# Patient Record
Sex: Female | Born: 1990 | Race: White | Hispanic: No | Smoking: Former smoker
Health system: Southern US, Community
[De-identification: ages and names within clinical notes are randomized; demographics above are authoritative.]

## PROBLEM LIST (undated history)

## (undated) HISTORY — PX: NO PAST SURGERIES: SHX2092

---
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
## 2009-10-14 IMAGING — CR DG HAND COMPLETE 3+V*L*
3 series · 3 of 3 positions shown · non-contrast
Comparison: none

CLINICAL DATA: 16-year-old with silver trauma.
 LEFT HAND ? 3 VIEW:

[x hand pa left]
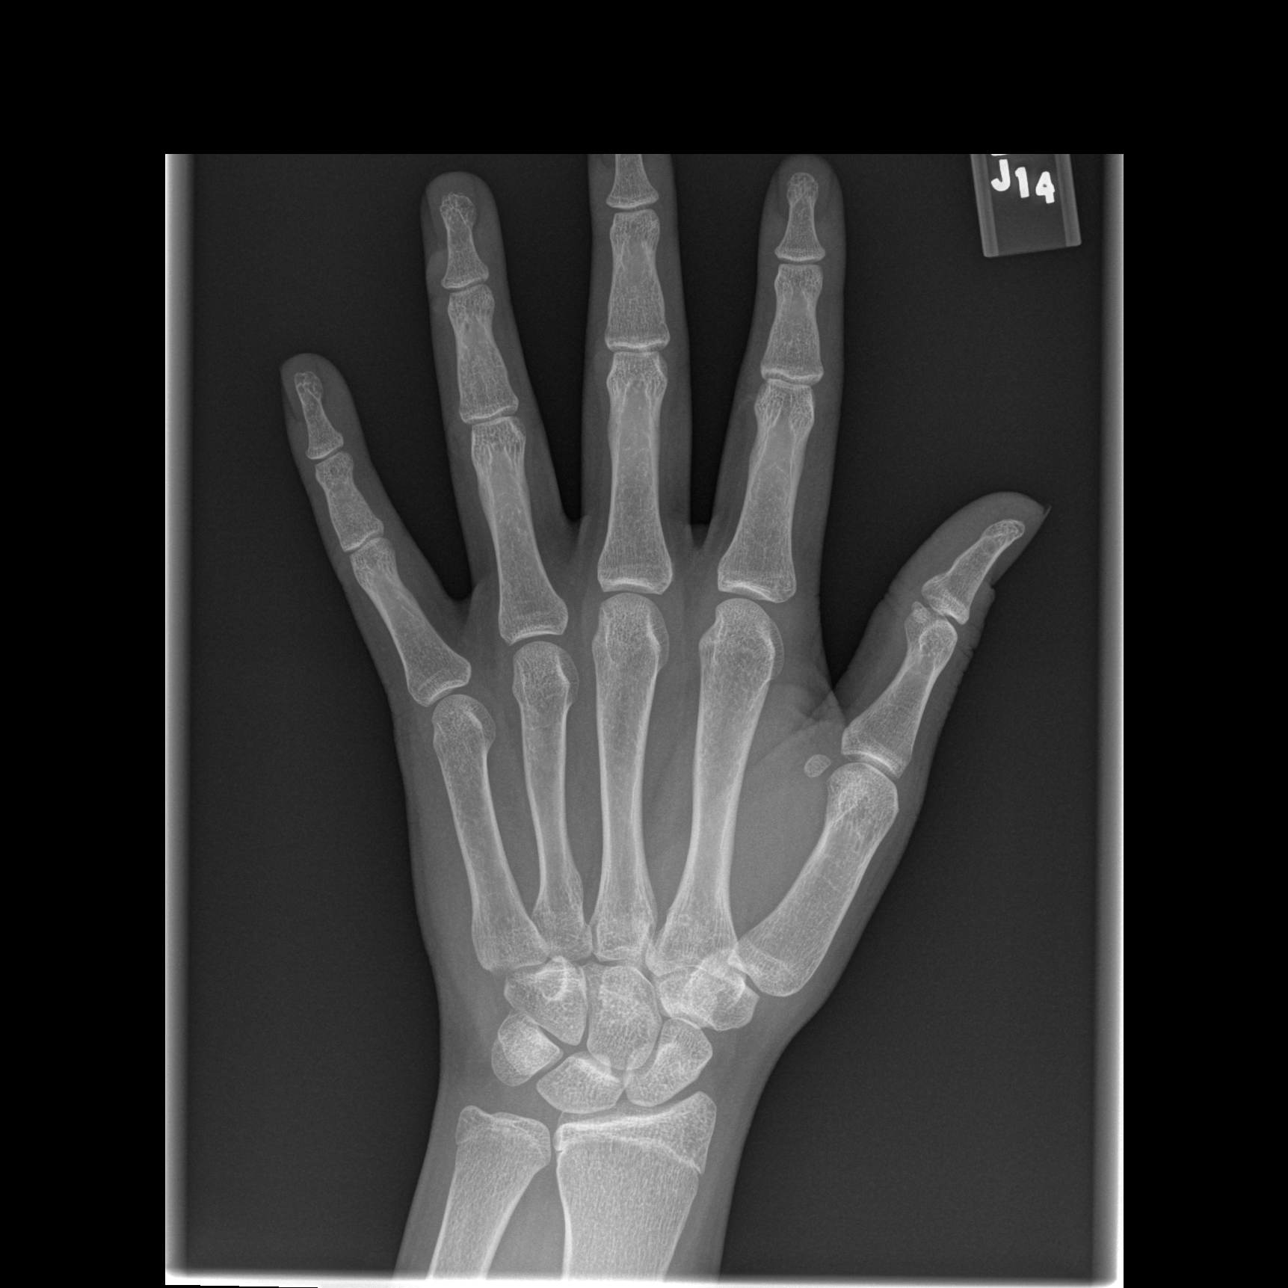

[x hand oblique left (1 of 2)]
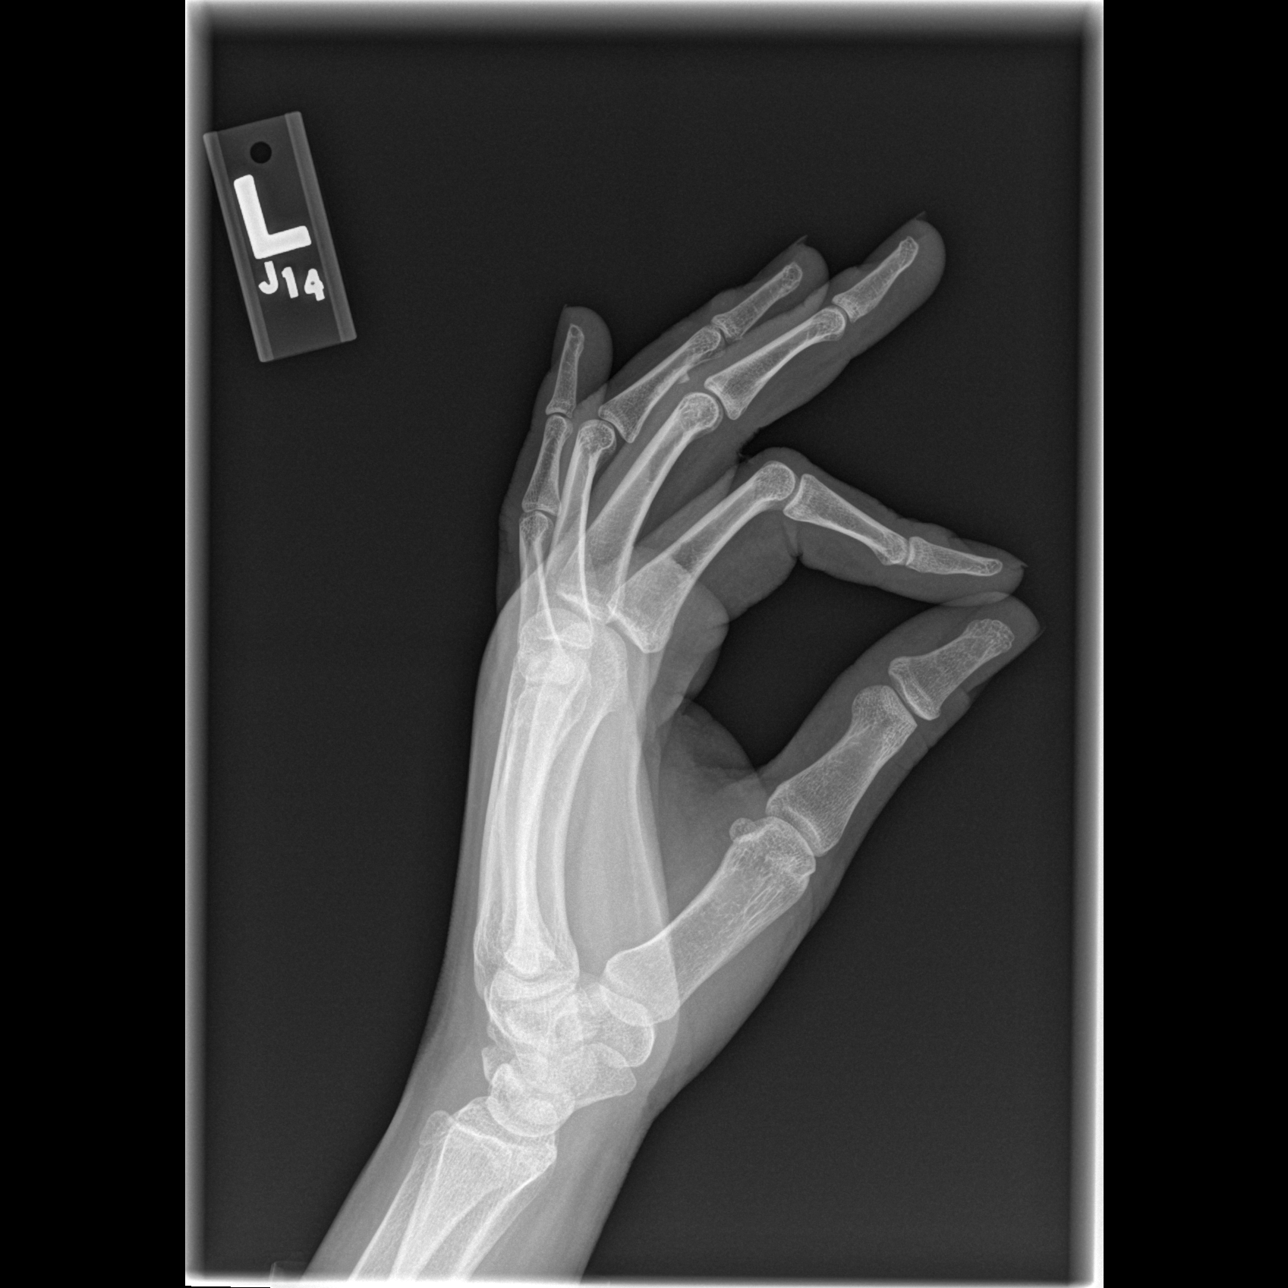

[x hand oblique left (2 of 2)]
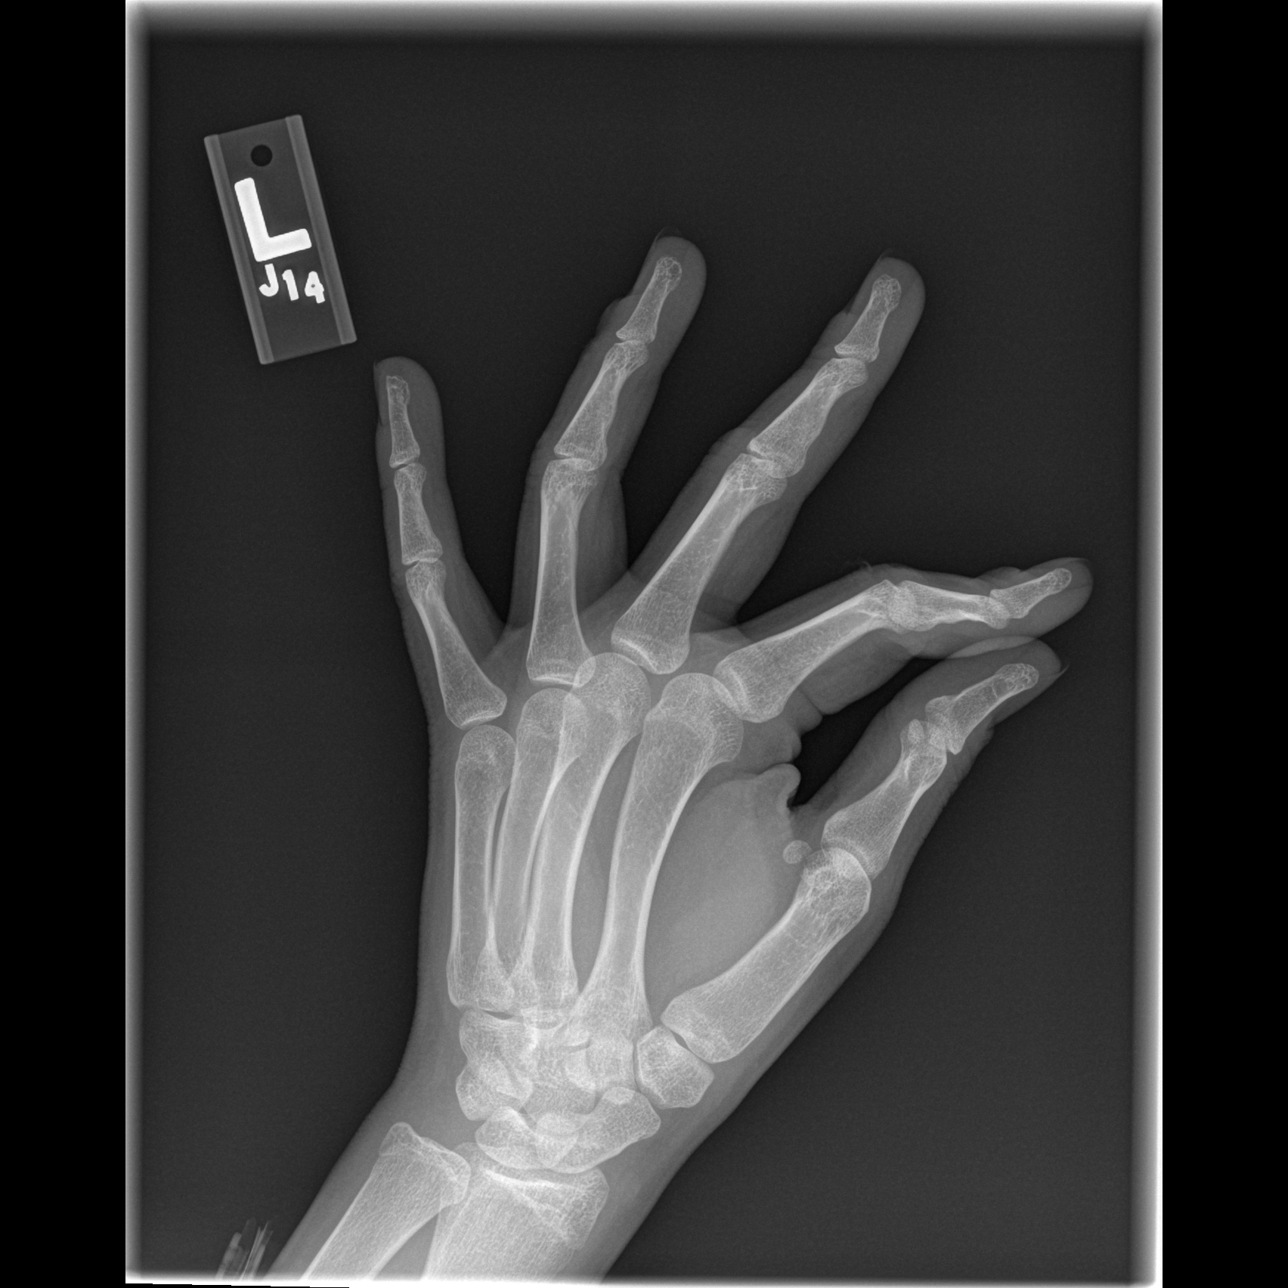

[3 of 3 positions shown; findings below may reference images not displayed]

FINDINGS: Three views of the left hand were obtained.  Normal alignment of the hand with incomplete closure of the distal radius physis.  No evidence for an acute fracture or dislocation.  The lateral view of the left hand demonstrates high-density material overlying the third and fourth fingers, possibly related to soft tissues.  Difficult to exclude foreign body in this area, although this is not identified on the additional images.
IMPRESSION: 1. No acute bone abnormalities to the left hand.
 2. Difficult to exclude a foreign body involving the third or fourth fingers, as described.

## 2010-12-11 LAB — I-STAT 8, (EC8 V) (CONVERTED LAB)
Acid-Base Excess: 2
Chloride: 106
Potassium: 3.9
Sodium: 140
TCO2: 29
pH, Ven: 7.388 — ABNORMAL HIGH

## 2010-12-11 LAB — URINALYSIS, ROUTINE W REFLEX MICROSCOPIC
Bilirubin Urine: NEGATIVE
Hgb urine dipstick: NEGATIVE
Nitrite: NEGATIVE
Protein, ur: NEGATIVE
Specific Gravity, Urine: 1.02
Urobilinogen, UA: 0.2

## 2010-12-11 LAB — POCT PREGNANCY, URINE: Operator id: 284141

## 2010-12-11 LAB — POCT I-STAT CREATININE
Creatinine, Ser: 0.6
Operator id: 284141

## 2022-03-09 ENCOUNTER — Encounter: Payer: Self-pay | Admitting: Family Medicine

## 2022-03-09 ENCOUNTER — Ambulatory Visit: Payer: Self-pay

## 2022-03-09 ENCOUNTER — Ambulatory Visit: Payer: No Typology Code available for payment source | Admitting: Family Medicine

## 2022-03-09 VITALS — BP 139/95 | Ht 66.0 in | Wt 132.8 lb

## 2022-03-09 DIAGNOSIS — F32A Depression, unspecified: Secondary | ICD-10-CM | POA: Insufficient documentation

## 2022-03-09 DIAGNOSIS — Z3009 Encounter for other general counseling and advice on contraception: Secondary | ICD-10-CM

## 2022-03-09 DIAGNOSIS — Z30432 Encounter for removal of intrauterine contraceptive device: Secondary | ICD-10-CM

## 2022-03-09 DIAGNOSIS — Z01419 Encounter for gynecological examination (general) (routine) without abnormal findings: Secondary | ICD-10-CM

## 2022-03-09 DIAGNOSIS — Z113 Encounter for screening for infections with a predominantly sexual mode of transmission: Secondary | ICD-10-CM

## 2022-03-09 LAB — WET PREP FOR TRICH, YEAST, CLUE
Trichomonas Exam: NEGATIVE
Yeast Exam: NEGATIVE

## 2022-03-09 MED ORDER — NORGESTIM-ETH ESTRAD TRIPHASIC 0.18/0.215/0.25 MG-35 MCG PO TABS: 1.0000 | ORAL_TABLET | Freq: Every day | ORAL | 12 refills | Status: DC

## 2022-03-09 NOTE — Progress Notes (Signed)
[facility] DEPARTMENT [facility] [address] Main Number: [phone]    Family Planning Visit- Initial Visit  Subjective:  Stephanie Reeves is a 31 y.o.  G1P1000   being seen today for an initial annual visit and to discuss reproductive life planning.  The patient is currently using IUD or IUS for pregnancy prevention. Patient reports   does not want a pregnancy in the next year.     report they are looking for a method that provides Does not want something inserted  Patient has the following medical conditions has Depression on their problem list.  Chief Complaint  Patient presents with   Contraception    PE and IUD removal    Patient reports to clinic for PE and IUD removal. Patient has had lots of anxiety surrounding her IUD removal, and has had the IUD for more than 10 years. It is a [employer] fast streamer.    Body mass index is 21.43 kg/m. - Patient is eligible for diabetes screening based on BMI and age >64?  not applicable HA1C ordered? not applicable  Patient reports 2  partner/s in last year. Desires STI screening?  Yes  Has patient been screened once for HCV in the past?  No  No results found for: "HCVAB"  Does the patient have current drug use (including MJ), have a partner with drug use, and/or has been incarcerated since last result? No  If yes-- Screen for HCV through [facility]   Does the patient meet criteria for HBV testing? No  Criteria:  -Household, sexual or needle sharing contact with HBV -History of drug use -HIV positive -Those with known Hep C   Health Maintenance Due  Topic Date Due   COVID-19 Vaccine (1) Never done   HIV Screening  Never done   Hepatitis C Screening  Never done   DTaP/Tdap/Td (1 - Tdap) Never done   PAP SMEAR-Modifier  Never done   INFLUENZA VACCINE  Never done    Review of Systems  Constitutional:  Positive for weight loss.  Respiratory:  Positive for shortness of breath. Negative for  wheezing.   Cardiovascular:  Negative for chest pain.  Genitourinary:  Positive for frequency.  Neurological:  Positive for dizziness and headaches.  Endo/Heme/Allergies:  Bruises/bleeds easily.  Psychiatric/Behavioral:  Positive for depression. Negative for suicidal ideas. The patient is nervous/anxious.     The following portions of the patient's history were reviewed and updated as appropriate: allergies, current medications, past family history, past medical history, past social history, past surgical history and problem list. Problem list updated.   See flowsheet for other program required questions.  Objective:   Vitals:   03/09/22 1310  BP: (!) 139/95  Weight: 132 lb 12.8 oz (60.2 kg)  Height: 5\' 6"  (1.676 m)    Physical Exam Vitals and nursing note reviewed. Exam conducted with a chaperone present.  Constitutional:      Appearance: Normal appearance.  HENT:     Head: Normocephalic and atraumatic.     Mouth/Throat:     Mouth: Mucous membranes are moist.     Pharynx: Oropharynx is clear. No oropharyngeal exudate or posterior oropharyngeal erythema.  Pulmonary:     Effort: Pulmonary effort is normal.  Chest:     Comments: CBE deferred Abdominal:     General: Abdomen is flat.     Palpations: There is no mass.     Tenderness: There is no abdominal tenderness. There is no rebound.  Genitourinary:    General: Normal vulva.     Exam position: Lithotomy position.     Pubic Area: No rash or pubic lice.      Labia:        Right: No rash or lesion.        Left: No rash or lesion.      Vagina: Normal. No vaginal discharge, erythema, bleeding or lesions.     Cervix: No discharge, friability, lesion or erythema.     Rectum: Normal.     Comments: pH = 4  IUD removed- Patient very nervous during exam. Uncomfortable with speculum placement, felt faint after removal of IUD- did not complete exam. No bimanual exam done.  Lymphadenopathy:     Head:     Right side of head: No  preauricular or posterior auricular adenopathy.     Left side of head: No preauricular or posterior auricular adenopathy.     Cervical: No cervical adenopathy.     Upper Body:     Right upper body: No supraclavicular, axillary or epitrochlear adenopathy.     Left upper body: No supraclavicular, axillary or epitrochlear adenopathy.     Lower Body: No right inguinal adenopathy. No left inguinal adenopathy.  Skin:    General: Skin is warm and dry.     Findings: No rash.  Neurological:     Mental Status: She is alert and oriented to person, place, and time.  Psychiatric:        Mood and Affect: Mood is anxious.       Assessment and Plan:  Stephanie Reeves is a 31 y.o. female presenting to the [facility] Department for an initial annual wellness/contraceptive visit  Contraception counseling: Reviewed options based on patient desire and reproductive life plan. Patient is interested in Oral Contraceptive. This was provided to the patient today.   Risks, benefits, and typical effectiveness rates were reviewed.  Questions were answered.  Written information was also given to the patient to review.    The patient will follow up in  1 years for surveillance.  The patient was told to call with any further questions, or with any concerns about this method of contraception.  Emphasized use of condoms 100% of the time for STI prevention.  Need for ECP was assessed.  1. Well woman exam with routine gynecological exam Pt has multiple complaints today: cold intolerance, weight loss, headaches, SOB. She attributes some of these to her anxiety and smoking status. Advised that patient should get a PCP to evaluate these concerns- as this could be an indication of asthma, thyroid disease etc. Deferred CBE today- patient panicked about visit- and declined CBE today.  - IGP, Aptima HPV  2. Encounter for IUD removal Patient states she has had this Mirena IUD for approx 14 years. Has had bad  anxiety about removal- so she has avoided coming to the office.  IUD Removal  Patient identified, informed consent performed, consent signed.  Patient was in the dorsal lithotomy position, normal external genitalia was noted.  A speculum was placed in the patient's vagina, normal discharge was noted, no lesions. The cervix was visualized, no lesions, no abnormal discharge.  The strings of the IUD were grasped and pulled using ring forceps. The IUD was removed in its entirety. Cytobrush was attempted which was unsuccessful so Kelly forceps were introduced into the endometrial cavity and the IUD was grasped and removed in its entirety).  Patient tolerated the procedure well.  Patient will use OCPs for contraception. Routine preventative health maintenance measures emphasized.   3. Family planning  - Norgestimate-Ethinyl Estradiol Triphasic (TRI-SPRINTEC) 0.18/0.215/0.25 MG-35 MCG tablet; Take 1 tablet by mouth daily.  Dispense: 28 tablet; Refill: 12  4. Screening for venereal disease Patient accepted all screenings including oral, vaginal CT/GC and wet prep. Patient meets criteria for HepB screening? No. Ordered? No - declined.  Patient meets criteria for HepC screening? No. Ordered? No - declined.   Treat wet prep per standing order Discussed time line for State Lab results and that patient will be called with positive results and encouraged patient to call if she had not heard in 2 weeks.  Counseled to return or seek care for continued or worsening symptoms Recommended condom use with all sex  Patient is currently using Hormonal Contraception: Injection, Rings and Patches to prevent pregnancy.   - Chlamydia/Gonorrhea [facility] - WET PREP FOR TRICH, YEAST, CLUE - Gonococcus culture  5. Depression, unspecified depression type PHQ-9 score of 14. Patient wanted card for therapist. States she has had a "lot of things going on right now". Declined referral to therapist- would like to reach  out herself. Denied suicidal ideation.    Return if symptoms worsen or fail to improve.  Total time spent 40 minutes.   Lenice Llamas, [place]

## 2022-03-09 NOTE — Progress Notes (Signed)
Pt appointment for PE, Pap, STI screening, and IUD removal. Pt seen by [facility]. Family planning packet given and contents reviewed. Initial lab results all negative and reviewed with pt.

## 2022-03-14 LAB — GONOCOCCUS CULTURE

## 2022-03-18 LAB — IGP, APTIMA HPV
HPV Aptima: POSITIVE — AB
PAP Smear Comment: 0

## 2022-03-23 ENCOUNTER — Telehealth: Payer: Self-pay

## 2022-03-23 NOTE — Telephone Encounter (Signed)
Calling pt re pap result from 03/09/22 specimen and colpo referral. 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  Insurance? Where does she want to go for colpo?

## 2022-03-24 NOTE — Telephone Encounter (Signed)
Phone call to pt at [phone]. Mailbox full, unable to leave voicemail. Tried twice.  Sent MyChart message.

## 2022-03-25 NOTE — Telephone Encounter (Signed)
Phone call to pt at [phone]. Mailbox full, unable to leave voicemail.

## 2022-04-02 NOTE — Telephone Encounter (Signed)
Phone call to [phone]. Left voicemail message that RN with [facility] is calling re pap and referral. Please call Phyllis Abelson at [phone]. Tried twice.

## 2022-04-02 NOTE — Telephone Encounter (Signed)
Colpo referral sent to [place] through Epic referral tab.

## 2022-04-02 NOTE — Telephone Encounter (Signed)
Received phone call back from pt and pt confirmed identity. Discussed pap result and colpo referral. Pt states she has [employer] and has been to [facility] before, would like to go back there for colpo. Explained that they are now [facility] and to expect a call from them. Pt expressed understanding.

## 2022-04-28 ENCOUNTER — Telehealth: Payer: Self-pay

## 2022-04-28 NOTE — Telephone Encounter (Addendum)
Received a call from [place] with [place]. They have not been able to contact pt for colpo scheduling.   (Reference [facility] provider order for colpo on 03/09/22 pap result.)  -Return call to [place] -Try to call pt -Send certified letter to pt

## 2022-04-28 NOTE — Telephone Encounter (Signed)
Phone call to pt at [phone]. Mailbox full unable to leave message.  Returned phone call to Systems developer at [employer], [phone]. Spoke with Desmond Lope. [facility] received the message today from them. Desmond Lope stated they have made multiple attempts without any response from pt. Desma Mcgregor for her efforts and for reaching out to [facility].

## 2022-05-04 NOTE — Telephone Encounter (Signed)
[id number] Certified letter mailed to pt re [facility] not being able to reach her to schedule colpo appt.  See scanned copy.

## 2022-07-01 ENCOUNTER — Telehealth: Payer: Self-pay

## 2022-07-01 NOTE — Telephone Encounter (Deleted)
-----   Message from [place], [place] sent at 07/01/2022  8:21 AM EDT ----- Regarding: RE: colpo / next pap Hi Zade Falkner,  Please tell her to repeat her pap test in December (at 1 year). This patient is highly highly anxious, and I'm not surprised she didn't do the colpo. We can try to follow her as best as possible with pap smears Q1 year for now. Please send her a reminder letter for pap for Dec 2024  Thanks Lissa Hoard ----- Message ----- From: Tracey Harries, RN Sent: 06/30/2022   4:34 PM EDT To: William Hamburger, RN; Lenice Llamas, FNP Subject: colpo / next pap                               [facility],  This pt never went for the colpo. I tried to call a few times and also sent her a certified letter.  I have not received any response.   When would you want to repeat her pap?   Please copy Nicholos Johns so she can send out a reminder card.   Thank you, Ulyess Blossom

## 2022-07-01 NOTE — Telephone Encounter (Signed)
Per Lenice Llamas, FNP, tell pt to repeat pap test in December 2024 (at 1 year).  Phone call to pt at [phone]. Left message on voicemail stating RN with [facility] is calling, next pap smear is due 02/2023; it appears that she may not be interested in having the colpo done but still would want to proceed with pap smears, next due 02/2023. Please call Tavis Kring with [phone] with any questions.

## 2022-07-20 ENCOUNTER — Emergency Department: Payer: No Typology Code available for payment source | Admitting: Registered Nurse

## 2022-07-20 ENCOUNTER — Other Ambulatory Visit: Payer: Self-pay

## 2022-07-20 ENCOUNTER — Emergency Department: Payer: No Typology Code available for payment source

## 2022-07-20 ENCOUNTER — Ambulatory Visit
Admission: EM | Admit: 2022-07-20 | Discharge: 2022-07-20 | Disposition: A | Discharge status: Home / Self Care | Payer: No Typology Code available for payment source | Attending: Emergency Medicine | Admitting: Emergency Medicine

## 2022-07-20 ENCOUNTER — Encounter: Payer: Self-pay | Admitting: *Deleted

## 2022-07-20 ENCOUNTER — Encounter
Admission: EM | Disposition: A | Discharge status: Home / Self Care | Payer: Self-pay | Source: Home / Self Care | Attending: Emergency Medicine

## 2022-07-20 DIAGNOSIS — O00102 Left tubal pregnancy without intrauterine pregnancy: Secondary | ICD-10-CM | POA: Insufficient documentation

## 2022-07-20 DIAGNOSIS — F17210 Nicotine dependence, cigarettes, uncomplicated: Secondary | ICD-10-CM | POA: Insufficient documentation

## 2022-07-20 DIAGNOSIS — F17290 Nicotine dependence, other tobacco product, uncomplicated: Secondary | ICD-10-CM | POA: Diagnosis not present

## 2022-07-20 DIAGNOSIS — O009 Unspecified ectopic pregnancy without intrauterine pregnancy: Secondary | ICD-10-CM | POA: Diagnosis present

## 2022-07-20 HISTORY — PX: XI ROBOT ASSISTED DIAGNOSTIC LAPAROSCOPY: SHX6815

## 2022-07-20 LAB — APTT: aPTT: 29 seconds (ref 24–36)

## 2022-07-20 LAB — TYPE AND SCREEN: Antibody Screen: NEGATIVE

## 2022-07-20 LAB — CBC
HCT: 30.9 % — ABNORMAL LOW (ref 36.0–46.0)
Hemoglobin: 10.6 g/dL — ABNORMAL LOW (ref 12.0–15.0)
MCH: 32.7 pg (ref 26.0–34.0)
MCHC: 34.3 g/dL (ref 30.0–36.0)
MCV: 95.4 fL (ref 80.0–100.0)
Platelets: 278 10*3/uL (ref 150–400)
RBC: 3.24 MIL/uL — ABNORMAL LOW (ref 3.87–5.11)
RDW: 12.3 % (ref 11.5–15.5)
WBC: 12.7 10*3/uL — ABNORMAL HIGH (ref 4.0–10.5)
nRBC: 0 % (ref 0.0–0.2)

## 2022-07-20 LAB — URINALYSIS, ROUTINE W REFLEX MICROSCOPIC
Bacteria, UA: NONE SEEN
Bilirubin Urine: NEGATIVE
Glucose, UA: NEGATIVE mg/dL
Ketones, ur: NEGATIVE mg/dL
Leukocytes,Ua: NEGATIVE
Nitrite: NEGATIVE
Protein, ur: 30 mg/dL — AB
Specific Gravity, Urine: 1.029 (ref 1.005–1.030)
pH: 5 (ref 5.0–8.0)

## 2022-07-20 LAB — COMPREHENSIVE METABOLIC PANEL
ALT: 15 U/L (ref 0–44)
AST: 15 U/L (ref 15–41)
Albumin: 4 g/dL (ref 3.5–5.0)
Alkaline Phosphatase: 47 U/L (ref 38–126)
Anion gap: 8 (ref 5–15)
BUN: 17 mg/dL (ref 6–20)
CO2: 26 mmol/L (ref 22–32)
Calcium: 8.9 mg/dL (ref 8.9–10.3)
Chloride: 103 mmol/L (ref 98–111)
Creatinine, Ser: 0.67 mg/dL (ref 0.44–1.00)
GFR, Estimated: >60 mL/min (ref >60)
Glucose, Bld: 119 mg/dL — ABNORMAL HIGH (ref 70–99)
Potassium: 3.8 mmol/L (ref 3.5–5.1)
Sodium: 137 mmol/L (ref 135–145)
Total Bilirubin: 0.5 mg/dL (ref 0.3–1.2)
Total Protein: 7.3 g/dL (ref 6.5–8.1)

## 2022-07-20 LAB — BPAM RBC: Unit Type and Rh: 9500

## 2022-07-20 LAB — ABO/RH: ABO/RH(D): O NEG

## 2022-07-20 LAB — PREPARE RBC (CROSSMATCH)

## 2022-07-20 LAB — PROTIME-INR
INR: 1.1 (ref 0.8–1.2)
Prothrombin Time: 13.6 seconds (ref 11.4–15.2)

## 2022-07-20 LAB — LIPASE, BLOOD: Lipase: 23 U/L (ref 11–51)

## 2022-07-20 LAB — POC URINE PREG, ED: Preg Test, Ur: POSITIVE — AB

## 2022-07-20 LAB — HCG, QUANTITATIVE, PREGNANCY: hCG, Beta Chain, Quant, S: 3301 m[IU]/mL — ABNORMAL HIGH (ref <5)

## 2022-07-20 SURGERY — LAPAROSCOPY, DIAGNOSTIC, ROBOT-ASSISTED
Anesthesia: General | Site: Abdomen

## 2022-07-20 MED ORDER — FENTANYL CITRATE PF 50 MCG/ML IJ SOSY
50.0000 ug | PREFILLED_SYRINGE | Freq: Once | INTRAMUSCULAR | Status: AC
  Administered 2022-07-20: 50 ug via INTRAVENOUS
  Filled 2022-07-20: qty 1

## 2022-07-20 MED ORDER — FENTANYL CITRATE (PF) 100 MCG/2ML IJ SOLN: 25.0000 ug | INTRAMUSCULAR | Status: DC | PRN

## 2022-07-20 MED ORDER — DROPERIDOL 2.5 MG/ML IJ SOLN: 0.6250 mg | Freq: Once | INTRAMUSCULAR | Status: DC | PRN

## 2022-07-20 MED ORDER — LACTATED RINGERS IV BOLUS
1000.0000 mL | Freq: Once | INTRAVENOUS | Status: AC
  Administered 2022-07-20: 1000 mL via INTRAVENOUS

## 2022-07-20 MED ORDER — MIDAZOLAM HCL 2 MG/2ML IJ SOLN
INTRAMUSCULAR | Status: DC | PRN
  Administered 2022-07-20: 2 mg via INTRAVENOUS

## 2022-07-20 MED ORDER — LIDOCAINE HCL (CARDIAC) PF 100 MG/5ML IV SOSY
PREFILLED_SYRINGE | INTRAVENOUS | Status: DC | PRN
  Administered 2022-07-20: 50 mg via INTRAVENOUS

## 2022-07-20 MED ORDER — PROPOFOL 10 MG/ML IV BOLUS
INTRAVENOUS | Status: DC | PRN
  Administered 2022-07-20: 150 mg via INTRAVENOUS

## 2022-07-20 MED ORDER — LACTATED RINGERS IV SOLN: INTRAVENOUS | Status: DC

## 2022-07-20 MED ORDER — ACETAMINOPHEN 10 MG/ML IV SOLN
INTRAVENOUS | Status: AC
  Filled 2022-07-20: qty 100

## 2022-07-20 MED ORDER — 0.9 % SODIUM CHLORIDE (POUR BTL) OPTIME
TOPICAL | Status: DC | PRN
  Administered 2022-07-20: 500 mL

## 2022-07-20 MED ORDER — ONDANSETRON HCL 4 MG/2ML IJ SOLN
INTRAMUSCULAR | Status: AC
  Filled 2022-07-20: qty 2

## 2022-07-20 MED ORDER — DEXAMETHASONE SODIUM PHOSPHATE 10 MG/ML IJ SOLN
INTRAMUSCULAR | Status: DC | PRN
  Administered 2022-07-20: 10 mg via INTRAVENOUS

## 2022-07-20 MED ORDER — PROPOFOL 10 MG/ML IV BOLUS
INTRAVENOUS | Status: AC
  Filled 2022-07-20: qty 20

## 2022-07-20 MED ORDER — DEXMEDETOMIDINE HCL IN NACL 80 MCG/20ML IV SOLN
INTRAVENOUS | Status: DC | PRN
  Administered 2022-07-20: 4 ug via INTRAVENOUS
  Administered 2022-07-20: 8 ug via INTRAVENOUS
  Administered 2022-07-20 (×2): 4 ug via INTRAVENOUS

## 2022-07-20 MED ORDER — SODIUM CHLORIDE 0.9 % IV SOLN: 10.0000 mL/h | Freq: Once | INTRAVENOUS | Status: DC

## 2022-07-20 MED ORDER — ROCURONIUM BROMIDE 100 MG/10ML IV SOLN
INTRAVENOUS | Status: DC | PRN
  Administered 2022-07-20: 20 mg via INTRAVENOUS
  Administered 2022-07-20: 50 mg via INTRAVENOUS

## 2022-07-20 MED ORDER — MIDAZOLAM HCL 2 MG/2ML IJ SOLN
INTRAMUSCULAR | Status: AC
  Filled 2022-07-20: qty 2

## 2022-07-20 MED ORDER — ONDANSETRON 4 MG PO TBDP
4.0000 mg | ORAL_TABLET | Freq: Four times a day (QID) | ORAL | 0 refills | Status: DC | PRN
Start: 2022-07-20 — End: 2023-04-22

## 2022-07-20 MED ORDER — SUGAMMADEX SODIUM 200 MG/2ML IV SOLN
INTRAVENOUS | Status: DC | PRN
  Administered 2022-07-20: 200 mg via INTRAVENOUS

## 2022-07-20 MED ORDER — HYDROCODONE-ACETAMINOPHEN 5-325 MG PO TABS
1.0000 | ORAL_TABLET | Freq: Four times a day (QID) | ORAL | 0 refills | Status: AC | PRN
Start: 2022-07-20 — End: 2022-07-25

## 2022-07-20 MED ORDER — BUPIVACAINE HCL (PF) 0.5 % IJ SOLN
INTRAMUSCULAR | Status: AC
  Filled 2022-07-20: qty 30

## 2022-07-20 MED ORDER — IBUPROFEN 600 MG PO TABS
600.0000 mg | ORAL_TABLET | Freq: Four times a day (QID) | ORAL | 0 refills | Status: DC
Start: 2022-07-20 — End: 2023-04-22

## 2022-07-20 MED ORDER — SODIUM CHLORIDE 0.9 % IR SOLN
Status: DC | PRN
  Administered 2022-07-20: 1000 mL

## 2022-07-20 MED ORDER — PROMETHAZINE HCL 25 MG/ML IJ SOLN: 6.2500 mg | INTRAMUSCULAR | Status: DC | PRN

## 2022-07-20 MED ORDER — LIDOCAINE HCL (PF) 2 % IJ SOLN
INTRAMUSCULAR | Status: AC
  Filled 2022-07-20: qty 5

## 2022-07-20 MED ORDER — PHENYLEPHRINE 80 MCG/ML (10ML) SYRINGE FOR IV PUSH (FOR BLOOD PRESSURE SUPPORT)
PREFILLED_SYRINGE | INTRAVENOUS | Status: DC | PRN
  Administered 2022-07-20: 80 ug via INTRAVENOUS

## 2022-07-20 MED ORDER — OXYCODONE HCL 5 MG PO TABS
5.0000 mg | ORAL_TABLET | Freq: Once | ORAL | Status: AC | PRN
  Administered 2022-07-20: 5 mg via ORAL

## 2022-07-20 MED ORDER — FENTANYL CITRATE (PF) 100 MCG/2ML IJ SOLN
INTRAMUSCULAR | Status: AC
  Filled 2022-07-20: qty 2

## 2022-07-20 MED ORDER — ONDANSETRON HCL 4 MG/2ML IJ SOLN
INTRAMUSCULAR | Status: DC | PRN
  Administered 2022-07-20: 4 mg via INTRAVENOUS

## 2022-07-20 MED ORDER — BUPIVACAINE HCL 0.5 % IJ SOLN
INTRAMUSCULAR | Status: DC | PRN
  Administered 2022-07-20: 7 mL

## 2022-07-20 MED ORDER — OXYCODONE HCL 5 MG PO TABS
ORAL_TABLET | ORAL | Status: AC
  Filled 2022-07-20: qty 1

## 2022-07-20 MED ORDER — FENTANYL CITRATE (PF) 100 MCG/2ML IJ SOLN
INTRAMUSCULAR | Status: DC | PRN
  Administered 2022-07-20 (×3): 50 ug via INTRAVENOUS

## 2022-07-20 MED ORDER — ACETAMINOPHEN 10 MG/ML IV SOLN
1000.0000 mg | Freq: Once | INTRAVENOUS | Status: DC | PRN
  Administered 2022-07-20: 1000 mg via INTRAVENOUS

## 2022-07-20 MED ORDER — POVIDONE-IODINE 10 % EX SWAB: 2.0000 | Freq: Once | CUTANEOUS | Status: DC

## 2022-07-20 MED ORDER — OXYCODONE HCL 5 MG/5ML PO SOLN: 5.0000 mg | Freq: Once | ORAL | Status: AC | PRN

## 2022-07-20 SURGICAL SUPPLY — 54 items
ANCHOR TIS RET SYS 235ML (MISCELLANEOUS) IMPLANT
BAG URINE DRAIN 2000ML AR STRL (UROLOGICAL SUPPLIES) ×1 IMPLANT
CATH FOLEY 2WAY  5CC 16FR (CATHETERS) ×1
CATH URTH 16FR FL 2W BLN LF (CATHETERS) ×1 IMPLANT
COVER TIP SHEARS 8 DVNC (MISCELLANEOUS) IMPLANT
COVER WAND RF STERILE (DRAPES) ×1 IMPLANT
DERMABOND ADVANCED .7 DNX12 (GAUZE/BANDAGES/DRESSINGS) ×1 IMPLANT
DRAPE 3/4 80X56 (DRAPES) ×1 IMPLANT
DRAPE ARM DVNC X/XI (DISPOSABLE) ×3 IMPLANT
DRAPE COLUMN DVNC XI (DISPOSABLE) ×1 IMPLANT
DRAPE ROBOT W/ LEGGING 30X125 (DRAPES) ×1 IMPLANT
DRAPE UNDER BUTTOCK W/FLU (DRAPES) ×1 IMPLANT
ELECT REM PT RETURN 9FT ADLT (ELECTROSURGICAL) ×1
ELECTRODE REM PT RTRN 9FT ADLT (ELECTROSURGICAL) ×1 IMPLANT
FORCEPS BPLR 8 MD DVNC XI (FORCEP) ×1 IMPLANT
FORCEPS BPLR R/ABLATION 8 DVNC (INSTRUMENTS) ×1 IMPLANT
GLOVE BIO SURGEON STRL SZ7 (GLOVE) ×2 IMPLANT
GLOVE BIOGEL PI IND STRL 7.5 (GLOVE) ×3 IMPLANT
GOWN STRL REUS W/ TWL LRG LVL3 (GOWN DISPOSABLE) ×3 IMPLANT
GOWN STRL REUS W/TWL LRG LVL3 (GOWN DISPOSABLE) ×3
GRASPER SUT TROCAR 14GX15 (MISCELLANEOUS) IMPLANT
IRRIGATION STRYKERFLOW (MISCELLANEOUS) IMPLANT
IRRIGATOR STRYKERFLOW (MISCELLANEOUS)
IRRIGATOR SUCT 8 DISP DVNC XI (IRRIGATION / IRRIGATOR) IMPLANT
IV NS 1000ML (IV SOLUTION) ×1
IV NS 1000ML BAXH (IV SOLUTION) ×1 IMPLANT
KIT PINK PAD W/HEAD ARE REST (MISCELLANEOUS) ×1 IMPLANT
KIT PINK PAD W/HEAD ARM REST (MISCELLANEOUS) ×1 IMPLANT
LABEL OR SOLS (LABEL) ×1 IMPLANT
MANIFOLD NEPTUNE II (INSTRUMENTS) ×1 IMPLANT
NS IRRIG 1000ML POUR BTL (IV SOLUTION) ×1 IMPLANT
OBTURATOR OPTICAL STND 8 DVNC (TROCAR) ×1
OBTURATOR OPTICALSTD 8 DVNC (TROCAR) ×1 IMPLANT
PACK LAP CHOLECYSTECTOMY (MISCELLANEOUS) ×1 IMPLANT
PAD OB MATERNITY 4.3X12.25 (PERSONAL CARE ITEMS) ×1 IMPLANT
PAD PREP OB/GYN DISP 24X41 (PERSONAL CARE ITEMS) ×1 IMPLANT
SCISSORS MNPLR CVD DVNC XI (INSTRUMENTS) ×1 IMPLANT
SCRUB CHG 4% DYNA-HEX 4OZ (MISCELLANEOUS) ×1 IMPLANT
SEAL UNIV 5-12 XI (MISCELLANEOUS) ×3 IMPLANT
SEALER VESSEL EXT DVNC XI (MISCELLANEOUS) IMPLANT
SOL ELECTROSURG ANTI STICK (MISCELLANEOUS) ×1
SOLUTION ELECTROSURG ANTI STCK (MISCELLANEOUS) ×1 IMPLANT
SPONGE T-LAP 18X18 ~~LOC~~+RFID (SPONGE) IMPLANT
SURGILUBE 2OZ TUBE FLIPTOP (MISCELLANEOUS) ×1 IMPLANT
SUT MNCRL 4-0 (SUTURE) ×1
SUT MNCRL 4-0 27XMFL (SUTURE) ×1
SUT VICRYL 0 UR6 27IN ABS (SUTURE) IMPLANT
SUT VLOC 90 S/L VL9 GS22 (SUTURE) IMPLANT
SUTURE MNCRL 4-0 27XMF (SUTURE) ×1 IMPLANT
SYR 10ML LL (SYRINGE) ×1 IMPLANT
TOWEL OR 17X26 4PK STRL BLUE (TOWEL DISPOSABLE) ×1 IMPLANT
TRAP FLUID SMOKE EVACUATOR (MISCELLANEOUS) ×1 IMPLANT
TUBING EVAC SMOKE HEATED PNEUM (TUBING) ×1 IMPLANT
WATER STERILE IRR 500ML POUR (IV SOLUTION) ×1 IMPLANT

## 2022-07-20 NOTE — Anesthesia Preprocedure Evaluation (Signed)
Anesthesia Evaluation  Patient identified by MRN, date of birth, ID band Patient awake    Reviewed: Allergy & Precautions, H&P , NPO status , Patient's Chart, lab work & pertinent test results, reviewed documented beta blocker date and time   Airway Mallampati: II  TM Distance: >3 FB Neck ROM: full    Dental  (+) Teeth Intact   Pulmonary neg pulmonary ROS, Current Smoker   Pulmonary exam normal        Cardiovascular Exercise Tolerance: Good negative cardio ROS Normal cardiovascular exam Rhythm:regular Rate:Normal     Neuro/Psych  PSYCHIATRIC DISORDERS      negative neurological ROS     GI/Hepatic negative GI ROS, Neg liver ROS,,,  Endo/Other  negative endocrine ROS    Renal/GU Renal disease  negative genitourinary   Musculoskeletal   Abdominal   Peds  Hematology negative hematology ROS (+)   Anesthesia Other Findings Past Medical History: 2008: Kidney stones     Comment:  passed w/out surgery Past Surgical History: No date: NO PAST SURGERIES BMI    Body Mass Index: 23.40 kg/m     Reproductive/Obstetrics negative OB ROS                             Anesthesia Physical Anesthesia Plan  ASA: 2 and emergent  Anesthesia Plan: General ETT   Post-op Pain Management:    Induction:   PONV Risk Score and Plan: 3  Airway Management Planned:   Additional Equipment:   Intra-op Plan:   Post-operative Plan:   Informed Consent: I have reviewed the patients History and Physical, chart, labs and discussed the procedure including the risks, benefits and alternatives for the proposed anesthesia with the patient or authorized representative who has indicated his/her understanding and acceptance.     Dental Advisory Given  Plan Discussed with: CRNA  Anesthesia Plan Comments:        Anesthesia Quick Evaluation

## 2022-07-20 NOTE — ED Provider Notes (Signed)
[facility] Provider Note    Event Date/Time   First MD Initiated Contact with Patient 07/20/22 [phone]     (approximate)   History   Abdominal Pain   HPI  Stephanie Reeves is a 32 y.o. female who presents to the ED for evaluation of Abdominal Pain   G2 P1 at about [redacted] weeks gestation by LMP presents with worsening abdominal pain and vaginal bleeding.  Reports pain started intermittently to left lower quadrant about a week ago associated with sexual intercourse, was intermittent and mild, acutely worsening and becoming more severe in the past 1 day.  Reports vaginal bleeding alongside the worsening of the pain.  Reports pain generalized open to her RUQ as well.   Physical Exam   Triage Vital Signs: ED Triage Vitals  Enc Vitals Group     BP 07/20/22 0145 (!) 140/85     Pulse Rate 07/20/22 0145 99     Resp 07/20/22 0145 19     Temp 07/20/22 0145 98.3 F (36.8 C)     Temp Source 07/20/22 0145 Oral     SpO2 07/20/22 0145 100 %     Weight 07/20/22 0150 145 lb (65.8 kg)     Height 07/20/22 0150 5\' 6"  (1.676 m)     Head Circumference --      Peak Flow --      Pain Score 07/20/22 0149 8     Pain Loc --      Pain Edu? --      Excl. in GC? --     Most recent vital signs: Vitals:   07/20/22 0145 07/20/22 0715  BP: (!) 140/85 (!) 141/93  Pulse: 99 95  Resp: 19 20  Temp: 98.3 F (36.8 C)   SpO2: 100% 100%    General: Awake. Looks unwell, pale, diaphoretic.  She is quite anxious CV:  Good peripheral perfusion.  Tachycardic and regular Resp:  Normal effort.  Abd:  No distention.  Tender throughout with concerning peritoneal features MSK:  No deformity noted.  Neuro:  No focal deficits appreciated. Other:     ED Results / Procedures / Treatments   Labs (all labs ordered are listed, but only abnormal results are displayed) Labs Reviewed  COMPREHENSIVE METABOLIC PANEL - Abnormal; Notable for the following components:      Result Value   Glucose,  Bld 119 (*)    All other components within normal limits  CBC - Abnormal; Notable for the following components:   WBC 12.7 (*)    RBC 3.24 (*)    Hemoglobin 10.6 (*)    HCT 30.9 (*)    All other components within normal limits  URINALYSIS, ROUTINE W REFLEX MICROSCOPIC - Abnormal; Notable for the following components:   Color, Urine YELLOW (*)    APPearance HAZY (*)    Hgb urine dipstick LARGE (*)    Protein, ur 30 (*)    All other components within normal limits  HCG, QUANTITATIVE, PREGNANCY - Abnormal; Notable for the following components:   hCG, Beta Chain, Quant, S 3,301 (*)    All other components within normal limits  POC URINE PREG, ED - Abnormal; Notable for the following components:   Preg Test, Ur POSITIVE (*)    All other components within normal limits  LIPASE, BLOOD  PROTIME-INR  APTT  PREPARE RBC (CROSSMATCH)  TYPE AND SCREEN    EKG   RADIOLOGY Bedside ultrasound performed by me with free fluid in the  pelvis  Official radiology report(s): No results found.  PROCEDURES and INTERVENTIONS:  .Critical Care  Performed by: Delton Prairie, MD Authorized by: Delton Prairie, MD   Critical care provider statement:    Critical care time (minutes):  30   Critical care time was exclusive of:  Separately billable procedures and treating other patients   Critical care was necessary to treat or prevent imminent or life-threatening deterioration of the following conditions:  Shock   Critical care was time spent personally by me on the following activities:  Development of treatment plan with patient or surrogate, discussions with consultants, evaluation of patient's response to treatment, examination of patient, ordering and review of laboratory studies, ordering and review of radiographic studies, ordering and performing treatments and interventions, pulse oximetry, re-evaluation of patient's condition and review of old charts   Medications  0.9 %  sodium chloride infusion  (has no administration in time range)  fentaNYL (SUBLIMAZE) injection 50 mcg (50 mcg Intravenous Given 07/20/22 0648)  lactated ringers bolus 1,000 mL (1,000 mLs Intravenous New Bag/Given 07/20/22 0648)     IMPRESSION / MDM / ASSESSMENT AND PLAN / ED COURSE  I reviewed the triage vital signs and the nursing notes.  Differential diagnosis includes, but is not limited to, placental abruption, ectopic pregnancy, ruptured ectopic, acute cystitis, ovarian torsion  {Patient presents with symptoms of an acute illness or injury that is potentially life-threatening.  32 year old woman presents with acute abdominal pain and concern for ruptured ectopic pregnancy requiring OB evaluation for operative intervention.  She is tachycardic with normotensive blood pressures.  She looks unwell though.  Very tender abdomen.  Bedside ultrasound with free fluid in the positive pregnancy test is very concerning for ruptured ectopic.  I consult OB/GYN recommends formal ultrasound but also comes and sees the patient around this time and agrees.  Hemoglobin of 10 without recent comparison.  Normal metabolic panel and lipase.  Urine without infectious features.  Admit to OB for the OR  Clinical Course as of 07/20/22 0725  Tue Jul 20, 2022  0630 I performed a quick bedside ultrasound and no free fluid in the pelvis on suprapubic views, nothing in the right upper quadrant.  She is very tender and I am concerned about ruptured ectopic.  OB paged [DS]  [phone] I consult with Dr. Thomasene Mohair and explained my concerns about ruptured ectopic pregnancy.  He requests a formal ultrasound performed to get some images [DS]  0642 Reassessed, u/s tech at the bedside [DS]  [phone] Dr. Jean Rosenthal in the ED to see the patient and take her to the OR.  He request that I order a couple units of blood to hold, which I do [DS]    Clinical Course User Index [DS] Delton Prairie, MD     FINAL CLINICAL IMPRESSION(S) / ED DIAGNOSES   Final  diagnoses:  Ruptured ectopic pregnancy     Rx / DC Orders   ED Discharge Orders     None        Note:  This document was prepared using Dragon voice recognition software and may include unintentional dictation errors.   Delton Prairie, MD 07/20/22 [phone]

## 2022-07-20 NOTE — Discharge Instructions (Signed)
AMBULATORY SURGERY  ?DISCHARGE INSTRUCTIONS ? ? ?The drugs that you were given will stay in your system until tomorrow so for the next 24 hours you should not: ? ?Drive an automobile ?Make any legal decisions ?Drink any alcoholic beverage ? ? ?You may resume regular meals tomorrow.  Today it is better to start with liquids and gradually work up to solid foods. ? ?You may eat anything you prefer, but it is better to start with liquids, then soup and crackers, and gradually work up to solid foods. ? ? ?Please notify your doctor immediately if you have any unusual bleeding, trouble breathing, redness and pain at the surgery site, drainage, fever, or pain not relieved by medication. ? ? ? ?Additional Instructions: ? ? ? ?Please contact your physician with any problems or Same Day Surgery at [phone], Monday through Friday 6 am to 4 pm, or [facility] [place] Main number at [phone].  ?

## 2022-07-20 NOTE — Anesthesia Procedure Notes (Signed)
Procedure Name: Intubation Date/Time: 07/20/2022 8:39 AM  Performed by: Hezzie Bump, CRNAPre-anesthesia Checklist: Patient identified, Patient being monitored, Timeout performed, Emergency Drugs available and Suction available Patient Re-evaluated:Patient Re-evaluated prior to induction Oxygen Delivery Method: Circle system utilized Preoxygenation: Pre-oxygenation with 100% oxygen Induction Type: IV induction Ventilation: Mask ventilation without difficulty Laryngoscope Size: Mac and 3 Grade View: Grade I Tube type: Oral Tube size: 7.0 mm Number of attempts: 1 Airway Equipment and Method: Stylet Placement Confirmation: ETT inserted through vocal cords under direct vision, positive ETCO2 and breath sounds checked- equal and bilateral Secured at: 22 cm Tube secured with: Tape Dental Injury: Teeth and Oropharynx as per pre-operative assessment

## 2022-07-20 NOTE — H&P (Signed)
GYNECOLOGY PRE-OPERATIVE HISTORY AND PHYSICAL NOTE  GYN Consultation and History and Physical  Attending Provider: Delton Prairie, MD   Stephanie Reeves [id number] 07/20/2022 7:40 AM    Chief Complaint:   Stephanie Reeves is a 32 y.o. G2P1001 premenopausal female seen at the request of Dr. Delton Prairie, MD, for evaluation of possible ectopic pregnancy.    History of Present Ilness:   The patient presented overnight for a worsening abdominal pain. The onset of pain was about 1 week ago. However, yesterday the pain become worse throughout the day and last night the pain was so bad that she decided to come to the ER. She did not know she was pregnancy and states that she was taking contraception.  She has a teenage child. She denies a history of ectopic pregnancy and pelvic surgery.  She denies other known risk factors for ectopic pregnancy.  She does not vaginal bleeding that is not heavy.  Her abdominal pain is more generalized at this point, but she notes that if there is a sharp increase in pain, the pain is located more in her lower abdomen. She denies other symptoms.     Past Medical History:  Diagnosis Date   Kidney stones 2008   passed w/out surgery   Past Surgical History:  Procedure Laterality Date   NO PAST SURGERIES     Allergies  Allergen Reactions   Poison Ivy Extract Rash   Prior to Admission medications   Medication Sig Start Date End Date Taking? Authorizing Provider  Norgestimate-Ethinyl Estradiol Triphasic (TRI-SPRINTEC) 0.18/0.215/0.25 MG-35 MCG tablet Take 1 tablet by mouth daily. 03/09/22   Lenice Llamas, FNP     Obstetric History: She is a G27P1000 female s/p SVD x 1.     Social History:  She  reports that she has been smoking cigarettes and e-cigarettes. She has a 5.00 pack-year smoking history. She has never used smokeless tobacco. She reports current alcohol use of about 1.0 standard drink of alcohol per week. She reports current drug use. Frequency: 7.00  times per week. Drug: Marijuana.  Family History:  family history includes Bone cancer in her paternal grandfather.   Review of Systems  Constitutional: Negative.   HENT: Negative.    Eyes: Negative.   Respiratory: Negative.    Cardiovascular: Negative.   Gastrointestinal:  Positive for abdominal pain. Negative for nausea and vomiting.  Genitourinary: Negative.   Musculoskeletal: Negative.   Skin: Negative.   Neurological: Negative.   Psychiatric/Behavioral: Negative.       Objective    BP (!) 141/93   Pulse 95   Temp 98.3 F (36.8 C) (Oral)   Resp 20   Ht 5\' 6"  (1.676 m)   Wt 65.8 kg   LMP 06/06/2022 (Exact Date)   SpO2 100%   BMI 23.40 kg/m  Physical Exam Constitutional:      General: She is in acute distress (mild-moderate distress, appears anxious).     Appearance: Normal appearance. She is well-developed.  HENT:     Head: Normocephalic and atraumatic.  Eyes:     General: No scleral icterus.    Conjunctiva/sclera: Conjunctivae normal.  Cardiovascular:     Rate and Rhythm: Regular rhythm. Tachycardia present.     Heart sounds: No murmur heard.    No friction rub. No gallop.  Pulmonary:     Effort: Pulmonary effort is normal. No respiratory distress.     Breath sounds: Normal breath sounds. No wheezing or rales.  Abdominal:  General: Bowel sounds are normal. There is no distension.     Palpations: Abdomen is soft. There is no mass.     Tenderness: There is abdominal tenderness (especially in lower abdomen). There is no guarding or rebound.  Musculoskeletal:        General: Normal range of motion.     Cervical back: Normal range of motion and neck supple.  Neurological:     General: No focal deficit present.     Mental Status: She is alert and oriented to person, place, and time.     Cranial Nerves: No cranial nerve deficit.  Skin:    General: Skin is warm and dry.     Findings: No erythema.  Psychiatric:        Mood and Affect: Mood normal.         Behavior: Behavior normal.        Judgment: Judgment normal.     Laboratory Results:   Lab Results  Component Value Date   WBC 12.7 (H) 07/20/2022   RBC 3.24 (L) 07/20/2022   HGB 10.6 (L) 07/20/2022   HCT 30.9 (L) 07/20/2022   PLT 278 07/20/2022   NA 137 07/20/2022   K 3.8 07/20/2022   CREATININE 0.67 07/20/2022   Lab Results  Component Value Date   PREGTESTUR POSITIVE (A) 07/20/2022    Imaging Results: Imaging Results US OB LESS THAN 14 WEEKS WITH OB TRANSVAGINAL  Result Date: 07/20/2022 CLINICAL DATA:  32 year old pregnant female with history of abdominal pain and vaginal bleeding for 1 week. EXAM: OBSTETRIC <14 WK [place] AND TRANSVAGINAL OB US TECHNIQUE: Both transabdominal and transvaginal ultrasound examinations were performed for complete evaluation of the gestation as well as the maternal uterus, adnexal regions, and pelvic cul-de-sac. Transvaginal technique was performed to assess early pregnancy. COMPARISON:  Pelvic ultrasound 12/09/2006. FINDINGS: Intrauterine gestational sac: None Yolk sac:  None Embryo:  None Cardiac Activity: None Heart Rate: N/A Subchorionic hemorrhage:  None visualized. Maternal uterus/adnexae: Right ovary was normal in appearance. In the left adnexa there is a complex adnexal mass with heterogeneous echotexture measuring approximately 2.6 x 1.7 x 1.9 cm, with extensive surrounding blood flow on color Doppler imaging, the appearance of which suggests a left adnexal gestational sac with surrounding hemorrhage. Moderate volume of free fluid also noted in the cul-de-sac. IMPRESSION: 1. Findings are highly concerning for left adnexal ectopic pregnancy, potentially ruptured. OB gyn consultation is strongly recommended. 2. No IUP identified. Critical Value/emergent results were called by telephone at the time of interpretation on 07/20/2022 at 7:35 am to provider [facility] , who verbally acknowledged these results. Electronically Signed   By: Trudie Reed M.D.    On: 07/20/2022 07:39      Assessment & Plan   Stephanie Reeves is a 69 y.o. G2P1000 premenopausal female with likely left ectopic pregnancy being seen in consultation.  Given concerning exam findings and suggestive clinical picture (pain, free fluid in abdomen, positive pregnancy test and hCG level, along with suggestive ultrasound findings), I'm highly concerned she has a ruptured or rupturing ectopic pregnancy.  I've discussed my strong recommendation for immediate treatment with surgery.  She was verbally consented by me for surgery and she agrees to proceed.     Patient will undergo surgical management with robot assisted laparoscopic left (or right if ectopic is actually found on right side) salpingectomy and removal of ectopic pregnancy.   The risks of surgery were discussed in detail with the patient including but not limited  to: bleeding which may require transfusion or reoperation; infection which may require antibiotics; injury to surrounding organs which may involve bowel, bladder, ureters ; need for additional procedures including laparoscopy or laparotomy; thromboembolic phenomenon, surgical site problems and other postoperative/anesthesia complications. Likelihood of success in alleviating the patient's condition was discussed. Routine postoperative instructions will be reviewed with the patient and her family in detail after surgery.  The patient concurred with the proposed plan, giving informed written consent for the surgery.  Anesthesia and OR aware.  Preoperative prophylactic antibiotics, as indicated, and SCDs ordered on call to the OR.  To OR when ready.   She has verbally agreed to receive blood products if necessary.   Thomasene Mohair, MD 07/20/2022 7:40 AM

## 2022-07-20 NOTE — ED Notes (Signed)
Report given to OR @this  time. Pt is dressed out in a gown and informed they are taking her up shortly

## 2022-07-20 NOTE — Op Note (Signed)
Operative Note    Name: Stephanie Reeves  Date of Service: 07/20/2022  DOB: 1990-06-02  MRN: [id number]   Pre-Operative Diagnosis: Suspected ruptured left ectopic pregnancy.  Post-Operative Diagnosis: Suspected ruptured left ectopic pregnancy.  Procedures:  1.  Robot-assisted laparoscopic left salpingectomy, removal of ectopic pregnancy  2.  Lysis of adhesions  Primary Surgeon: Thomasene Mohair, MD   EBL: 350 mL (about 325 mL hemoperitoneum, 25 mL of operative blood loss)  IVF: 1,000 mL   Urine output: 100 mL clear urine at the end of the procedure  Specimens: Left fallopian tube with suspected ectopic pregnancy  Drains: None  Complications: None   Disposition: PACU   Condition: Stable   Findings:  1.  Adhesions of anterior uterus and dilated left fallopian tube to anterior abdominal wall. 2.  Grossly enlarged distal left fallopian tube with a mixture of old clots and mild bleeding. 3.  Significant hemoperitoneum that was mostly well-organized and in the pelvis. 4.  Significant adhesions in the pelvis.  Unsure of relationship to ectopic pregnancy versus prior inflammatory event. 5.  Appendix visualized and appears normal though is encased in filmy adhesions in a lateral to medial fashion. 6.  Normal-appearing bilateral ovaries and right fallopian tube.  Right fallopian tube with mild distal adhesions to posterior cul-de-sac. 7.  Otherwise normal-appearing uterus.  Procedure Summary:  The patient was taken to the operating room where general anesthesia was administered and found to be adequate. She was placed in the dorsal supine lithotomy position in Vilas stirrups and prepped and draped in the usual sterile fashion. After a timeout was called an indwelling catheter was placed in her bladder.  A sponge on a stick was placed in her vagina for uterine manipulation.  Attention was turned to the abdomen where after injection of local anesthetic, an 8 mm infraumbilical  incision was made with the scalpel. Entry into the abdomen was obtained via Optiview trocar technique (a blunt entry technique with camera visualization through the obturator upon entry). Verification of entry into the abdomen was obtained using opening pressures. The abdomen was insufflated with CO2. The camera was introduced through the trocar with verification of atraumatic entry.  Right and left abdominal entry sites were created after injection of local anesthetic about 8 cm away from the umbilical port in accordance with the Intuitive manufacturer's recommendations.  The port sites were 8 mm.  Under direct intra-abdominal camera visualization the umbilical trocar was switched to a 12 mm trocar.  The intuitive trochars were introduced under intra-abdominal camera visualization without difficulty.  The XI robot was docked on the patient's left.  Clearance was verified from the patient's legs.  Through the umbilical port the camera was placed.  Through the port attached to arm 4 the monopolar scissors were placed.  Through the port attached to arm 2 the vessel sealer was was placed.    An initial survey of the pelvis was undertaken with the above-noted findings.  Due to the significant adhesions in the area of the ectopic pregnancy, lysis of adhesions was undertaken to free the left fallopian tube and ovary from the anterior abdominal wall and the left round ligament.  The left fallopian tube was also noted to be somewhat twisted without any obvious necrosis of the distal tissue.  Once the normal anatomy was restored, the mesosalpinx could be visualized.  Using the vessel sealer, the mesosalpinx was grasped cauterized and cut in a lateral to medial direction, freeing the fallopian tube with the ectopic pregnancy  included.  This was placed in the posterior cul-de-sac.  Using suction and the bipolar forceps, the operative pedicle was verified to be hemostatic.  The rest of the obvious adhesions were taken  down to restore normal anatomy.  The right fallopian tube was freed from its tenuous adhesion to the posterior cul-de-sac without including the actual fallopian tube.  The right fallopian tube and ovary appeared normal and free from other adhesions.  The abdomen and pelvis were evacuated of blood clots and blood.  At this point the robot was undocked after removing all instruments.  The specimen bag was placed through the umbilical trocar and the specimen was placed into the bag.  The specimen was removed from the abdominal cavity and several small pieces and then in 1 large piece.  The 12 mm trocar was replaced.  As much blood as possible was removed from the abdomen and after lowering the intra-abdominal pressure to 5 mm, hemostasis was again assured.  Irrigation was performed and suctioned.  All instruments were removed.  The abdomen was emptied of CO2 with the aid of 5 deep breaths from anesthesia.  All trocars were removed.  All skin incisions were closed using 4-0 Monocryl in a subcuticular fashion.  The closures were reinforced using surgical skin glue.  Attention was returned to the pelvis.  The sponge stick was removed from the vagina and the vagina was verified to be clear of all instruments and sponges.  The Foley catheter was removed.  The patient tolerated the procedure well.  Sponge, lap, needle, and instrument counts were correct x 2.  VTE prophylaxis: SCDs. Antibiotic prophylaxis: none indicated. She was awakened in the operating room and was taken to the PACU in stable condition.   Thomasene Mohair, MD 07/20/2022 10:28 AM

## 2022-07-20 NOTE — Transfer of Care (Signed)
Immediate Anesthesia Transfer of Care Note  Patient: Stephanie Reeves  Procedure(s) Performed: XI ROBOT ASSISTED  LAPAROSCOPIC LEFT SALPINGECTOMY AND REMOVAL OF ECTOPIC PREGNANCY, LYSIS OF ADHESIONS (Abdomen)  Patient Location: PACU  Anesthesia Type:General  Level of Consciousness: awake  Airway & Oxygen Therapy: Patient Spontanous Breathing  Post-op Assessment: Report given to RN and Post -op Vital signs reviewed and stable  Post vital signs: Reviewed and stable  Last Vitals:  Vitals Value Taken Time  BP    Temp    Pulse 88 07/20/22 1039  Resp 17 07/20/22 1039  SpO2 100 % 07/20/22 1039  Vitals shown include unvalidated device data.  Last Pain:  Vitals:   07/20/22 0803  TempSrc: Temporal  PainSc: 7          Complications: No notable events documented.

## 2022-07-20 NOTE — ED Notes (Signed)
OB and ultrasound were at bedside. OB Jean Rosenthal explained findings and plan. VS obtained. Pt helped to toilet to void, back in bed. Pt crying and asked for privacy to call partner. Report then given to assigned RN.

## 2022-07-20 NOTE — ED Notes (Signed)
Poct pregnancy Positive 

## 2022-07-20 NOTE — ED Notes (Signed)
Pt taken OTF by OR tech

## 2022-07-20 NOTE — ED Triage Notes (Signed)
Pt reports abd pain with vaginal bleeding for 1 week.  No vaginal discharge. Denies urinary sx.  Pt had intercourse last week and sx started afterwards  pt alert.

## 2022-07-21 LAB — BPAM RBC
Blood Product Expiration Date: 202405312359
Blood Product Expiration Date: 202406062359
Unit Type and Rh: 9500

## 2022-07-21 LAB — TYPE AND SCREEN
ABO/RH(D): O NEG
Antibody Screen: NEGATIVE
Unit division: 0
Unit division: 0

## 2022-07-22 LAB — TYPE AND SCREEN: ABO/RH(D): O NEG

## 2022-07-22 NOTE — Anesthesia Postprocedure Evaluation (Signed)
Anesthesia Post Note  Patient: Stephanie Reeves  Procedure(s) Performed: XI ROBOT ASSISTED  LAPAROSCOPIC LEFT SALPINGECTOMY AND REMOVAL OF ECTOPIC PREGNANCY, LYSIS OF ADHESIONS (Abdomen)  Patient location during evaluation: PACU Anesthesia Type: General Level of consciousness: awake and alert Pain management: pain level controlled Vital Signs Assessment: post-procedure vital signs reviewed and stable Respiratory status: spontaneous breathing, nonlabored ventilation, respiratory function stable and patient connected to nasal cannula oxygen Cardiovascular status: blood pressure returned to baseline and stable Postop Assessment: no apparent nausea or vomiting Anesthetic complications: no   No notable events documented.   Last Vitals:  Vitals:   07/20/22 1115 07/20/22 1145  BP: 110/61 122/72  Pulse: 76 89  Resp: 19 16  Temp: 37.2 C 36.5 C  SpO2: 100% 100%    Last Pain:  Vitals:   07/20/22 1145  TempSrc: Temporal  PainSc: 3                  Yevette Edwards

## 2022-07-23 LAB — SURGICAL PATHOLOGY

## 2023-03-21 ENCOUNTER — Other Ambulatory Visit: Payer: Self-pay | Admitting: Obstetrics and Gynecology

## 2023-03-21 DIAGNOSIS — Z8759 Personal history of other complications of pregnancy, childbirth and the puerperium: Secondary | ICD-10-CM

## 2023-03-21 DIAGNOSIS — O3680X0 Pregnancy with inconclusive fetal viability, not applicable or unspecified: Secondary | ICD-10-CM

## 2023-03-22 ENCOUNTER — Ambulatory Visit
Admission: RE | Admit: 2023-03-22 | Discharge: 2023-03-22 | Disposition: A | Discharge status: Home / Self Care | Payer: No Typology Code available for payment source | Source: Ambulatory Visit | Attending: Obstetrics and Gynecology | Admitting: Obstetrics and Gynecology

## 2023-03-22 DIAGNOSIS — Z8759 Personal history of other complications of pregnancy, childbirth and the puerperium: Secondary | ICD-10-CM | POA: Diagnosis present

## 2023-03-22 DIAGNOSIS — O3680X0 Pregnancy with inconclusive fetal viability, not applicable or unspecified: Secondary | ICD-10-CM | POA: Insufficient documentation

## 2023-04-22 ENCOUNTER — Telehealth (INDEPENDENT_AMBULATORY_CARE_PROVIDER_SITE_OTHER): Payer: No Typology Code available for payment source

## 2023-04-22 DIAGNOSIS — Z3480 Encounter for supervision of other normal pregnancy, unspecified trimester: Secondary | ICD-10-CM | POA: Insufficient documentation

## 2023-04-22 NOTE — Patient Instructions (Signed)
Commonly Asked Questions During Pregnancy  Cats: A parasite can be excreted in cat feces.  To avoid exposure you need to have another person empty the little box.  If you must empty the litter box you will need to wear gloves.  Wash your hands after handling your cat.  This parasite can also be found in raw or undercooked meat so this should also be avoided.  Colds, Sore Throats, Flu: Please check your medication sheet to see what you can take for symptoms.  If your symptoms are unrelieved by these medications please call the office.  Dental Work: Most any dental work Agricultural consultant recommends is permitted.  X-rays should only be taken during the first trimester if absolutely necessary.  Your abdomen should be shielded with a lead apron during all x-rays.  Please notify your provider prior to receiving any x-rays.  Novocaine is fine; gas is not recommended.  If your dentist requires a note from [place] prior to dental work please call the office and we will provide one for you.  Exercise: Exercise is an important part of staying healthy during your pregnancy.  You may continue most exercises you were accustomed to prior to pregnancy.  Later in your pregnancy you will most likely notice you have difficulty with activities requiring balance like riding a bicycle.  It is important that you listen to your body and avoid activities that put you at a higher risk of falling.  Adequate rest and staying well hydrated are a must!  If you have questions about the safety of specific activities ask your provider.    Exposure to Children with illness: Try to avoid obvious exposure; report any symptoms to [place] when noted,  If you have chicken pos, red measles or mumps, you should be immune to these diseases.   Please do not take any vaccines while pregnant unless you have checked with your OB provider.  Fetal Movement: After 28 weeks we recommend you do "kick counts" twice daily.  Lie or sit down in a calm quiet environment and  count your baby movements "kicks".  You should feel your baby at least 10 times per hour.  If you have not felt 10 kicks within the first hour get up, walk around and have something sweet to eat or drink then repeat for an additional hour.  If count remains less than 10 per hour notify your provider.  Fumigating: Follow your pest control agent's advice as to how long to stay out of your home.  Ventilate the area well before re-entering.  Hemorrhoids:   Most over-the-counter preparations can be used during pregnancy.  Check your medication to see what is safe to use.  It is important to use a stool softener or fiber in your diet and to drink lots of liquids.  If hemorrhoids seem to be getting worse please call the office.   Hot Tubs:  Hot tubs Jacuzzis and saunas are not recommended while pregnant.  These increase your internal body temperature and should be avoided.  Intercourse:  Sexual intercourse is safe during pregnancy as long as you are comfortable, unless otherwise advised by your provider.  Spotting may occur after intercourse; report any bright red bleeding that is heavier than spotting.  Labor:  If you know that you are in labor, please go to the hospital.  If you are unsure, please call the office and let us help you decide what to do.  Lifting, straining, etc:  If your job requires heavy  lifting or straining please check with your provider for any limitations.  Generally, you should not lift items heavier than that you can lift simply with your hands and arms (no back muscles)  Painting:  Paint fumes do not harm your pregnancy, but may make you ill and should be avoided if possible.  Latex or water based paints have less odor than oils.  Use adequate ventilation while painting.  Permanents & Hair Color:  Chemicals in hair dyes are not recommended as they cause increase hair dryness which can increase hair loss during pregnancy.  " Highlighting" and permanents are allowed.  Dye may be  absorbed differently and permanents may not hold as well during pregnancy.  Sunbathing:  Use a sunscreen, as skin burns easily during pregnancy.  Drink plenty of fluids; avoid over heating.  Tanning Beds:  Because their possible side effects are still unknown, tanning beds are not recommended.  Ultrasound Scans:  Routine ultrasounds are performed at approximately 20 weeks.  You will be able to see your baby's general anatomy an if you would like to know the gender this can usually be determined as well.  If it is questionable when you conceived you may also receive an ultrasound early in your pregnancy for dating purposes.  Otherwise ultrasound exams are not routinely performed unless there is a medical necessity.  Although you can request a scan we ask that you pay for it when conducted because insurance does not cover " patient request" scans.  Work: If your pregnancy proceeds without complications you may work until your due date, unless your physician or employer advises otherwise.  Round Ligament Pain/Pelvic Discomfort:  Sharp, shooting pains not associated with bleeding are fairly common, usually occurring in the second trimester of pregnancy.  They tend to be worse when standing up or when you remain standing for long periods of time.  These are the result of pressure of certain pelvic ligaments called "round ligaments".  Rest, Tylenol and heat seem to be the most effective relief.  As the womb and fetus grow, they rise out of the pelvis and the discomfort improves.  Please notify the office if your pain seems different than that described.  It may represent a more serious condition.  Common Medications Safe in Pregnancy  Acne:      Constipation:  Benzoyl Peroxide     Colace  Clindamycin      Dulcolax Suppository  Topica Erythromycin     Fibercon  Salicylic Acid      Metamucil         Miralax AVOID:        Senakot   Accutane    Cough:  Retin-A       Cough  Drops  Tetracycline      Phenergan w/ Codeine if Rx  Minocycline      Robitussin (Plain & DM)  Antibiotics:     Crabs/Lice:  Ceclor       RID  Cephalosporins    AVOID:  E-Mycins      Kwell  Keflex  Macrobid/Macrodantin   Diarrhea:  Penicillin      Kao-Pectate  Zithromax      Imodium AD         PUSH FLUIDS AVOID:       Cipro     Fever:  Tetracycline      Tylenol (Regular or Extra  Minocycline       Strength)  Levaquin      Extra Strength-Do not  Exceed 8 tabs/24 hrs Caffeine:        200mg /day (equiv. To 1 cup of coffee or  approx. 3 12 oz sodas)         Gas: Cold/Hayfever:       Gas-X  Benadryl      Mylicon  Claritin       Phazyme  **Claritin-D        Chlor-Trimeton    Headaches:  Dimetapp      ASA-Free Excedrin  Drixoral-Non-Drowsy     Cold Compress  Mucinex (Guaifenasin)     Tylenol (Regular or Extra  Sudafed/Sudafed-12 Hour     Strength)  **Sudafed PE Pseudoephedrine   Tylenol Cold & Sinus     Vicks Vapor Rub  Zyrtec  **AVOID if Problems With Blood Pressure         Heartburn: Avoid lying down for at least 1 hour after meals  Aciphex      Maalox     Rash:  Milk of Magnesia     Benadryl    Mylanta       1% Hydrocortisone Cream  Pepcid  Pepcid Complete   Sleep Aids:  Prevacid      Ambien   Prilosec       Benadryl  Rolaids       Chamomile Tea  Tums (Limit 4/day)     Unisom         Tylenol PM         Warm milk-add vanilla or  Hemorrhoids:       Sugar for taste  Anusol/Anusol H.C.  (RX: Analapram 2.5%)  Sugar Substitutes:  Hydrocortisone OTC     Ok in moderation  Preparation H      Tucks        Vaseline lotion applied to tissue with wiping    Herpes:     Throat:  Acyclovir      Oragel  Famvir  Valtrex     Vaccines:         Flu Shot Leg Cramps:       *Gardasil  Benadryl      Hepatitis A         Hepatitis B Nasal Spray:       Pneumovax  Saline Nasal Spray     Polio Booster         Tetanus Nausea:       Tuberculosis test or PPD  Vitamin  B6 25 mg TID   AVOID:    Dramamine      *Gardasil  Emetrol       Live Poliovirus  Ginger Root 250 mg QID    MMR (measles, mumps &  High Complex Carbs @ Bedtime    rebella)  Sea Bands-Accupressure    Varicella (Chickenpox)  Unisom 1/2 tab TID     *No known complications           If received before Pain:         Known pregnancy;   Darvocet       Resume series after  Lortab        Delivery  Percocet    Yeast:   Tramadol      Femstat  Tylenol 3      Gyne-lotrimin  Ultram       Monistat  Vicodin           MISC:         All Sunscreens  Hair Coloring/highlights          Insect Repellant's          (Including DEET)         Mystic Tans First Trimester of Pregnancy  The first trimester of pregnancy starts on the first day of your last monthly period until the end of week 13. This is months 1 through 3 of pregnancy. A week after a sperm fertilizes an egg, the egg will implant into the wall of the uterus and begin to develop into a baby. Body changes during your first trimester Your body goes through many changes during pregnancy. The changes usually return to normal after your baby is born. Physical changes Your breasts may grow larger and may hurt. The area around your nipples may get darker. Your periods will stop. Your hair and nails may grow faster. You may pee more often. Health changes You may tire easily. Your gums may bleed and may be sensitive when you brush and floss. You may not feel hungry. You may have heartburn. You may throw up or feel like you may throw up. You may want to eat some foods, but not others. You may have headaches. You may have trouble pooping (constipation). Other changes Your emotions may change from day to day. You may have more dreams. Follow these instructions at home: Medicines Talk to your health care provider if you're taking medicines. Ask if the medicines are safe to take during pregnancy. Your provider may change the medicines that  you take. Do not take any medicines unless told to by your provider. Take a prenatal vitamin that has at least 600 micrograms (mcg) of folic acid. Do not use herbal medicines, illegal substances, or medicines that are not approved by your provider. Eating and drinking While you're pregnant your body needs extra food for your growing baby. Talk with your provider about what to eat while pregnant. Activity Most women are able to exercise during pregnancy. Exercises may need to change as your pregnancy goes on. Talk to your provider about your activities and exercise routines. Relieving pain and discomfort Wear a good, supportive bra if your breasts hurt. Rest with your legs raised if you have leg cramps or low back pain. Safety Wear your seatbelt at all times when you're in a car. Talk to your provider if someone hits you, hurts you, or yells at you. Talk with your provider if you're feeling sad or have thoughts of hurting yourself. Lifestyle Certain things can be harmful while you're pregnant. Follow these rules: Do not use hot tubs, steam rooms, or saunas. Do not douche. Do not use tampons or scented pads. Do not drink alcohol,smoke, vape, or use products with nicotine or tobacco in them. If you need help quitting, talk with your provider. Avoid cat litter boxes and soil used by cats. These things carry germs that can cause harm to your pregnancy and your baby. General instructions Keep all follow-up visits. It helps you and your unborn baby stay as healthy as possible. Write down your questions. Take them to your visits. Your provider will: Talk with you about your overall health. Give you advice or refer you to specialists who can help with different needs, including: Prenatal education classes. Mental health and counseling. Foods and healthy eating. Ask for help if you need help with food. Call your dentist and ask to be seen. Brush your teeth with a soft toothbrush. Floss  gently. Where to find more information American Pregnancy Association: americanpregnancy.org  [employer] of [employer]stetricians and Gynecologists: acog.org Office on [employer] Health: TravelLesson.ca Contact a health care provider if: You feel dizzy, faint, or have a fever. You vomit or have watery poop (diarrhea) for 2 days or more. You have abnormal discharge or bleeding from your vagina. You have pain when you pee or your pee smells bad. You have cramps, pain, or pressure in your belly area. Get help right away if: You have trouble breathing or chest pain. You have any kind of injury, such as from a fall or a car crash. These symptoms may be an emergency. Get help right away. Call 911. Do not wait to see if the symptoms will go away. Do not drive yourself to the hospital. This information is not intended to replace advice given to you by your health care provider. Make sure you discuss any questions you have with your health care provider. Document Revised: 12/09/2022 Document Reviewed: 07/09/2022 Elsevier Patient Education  2024 [employer].

## 2023-04-22 NOTE — Progress Notes (Signed)
New OB Intake  I connected with  Stephanie Reeves on 04/22/23 at  3:15 PM EST by Video Visit and verified that I am speaking with the correct person using two identifiers. Nurse is located at [facility] and pt is located at home.  I explained I am completing New OB Intake today. We discussed her EDD of 11/13/23 that is based on ultrasound. Pt is G3/P1. I reviewed her allergies, medications, Medical/Surgical/OB history, and appropriate screenings. There are cats in the home yes. If yes Indoor. Based on history, this is a/an pregnancy uncomplicated . Her obstetrical history is significant for  N/A .  Patient Active Problem List   Diagnosis Date Noted   Supervision of other normal pregnancy, antepartum 04/22/2023   Ruptured ectopic pregnancy 07/20/2022   Depression 03/09/2022    Concerns addressed today: Patient states she stays cold all the time and is wondering if her iron is low.  Delivery Plans:  Plans to deliver at [facility] for now. She will move to [facility] in March so she has to figure out if she will change clinic.  Anatomy US Explained Anatomy scan will be done at [redacted] weeks gestational age.  Labs Discussed genetic screening with patient. Patient desires genetic testing to be drawn at new OB visit. Discussed possible labs to be drawn at new OB appointment.  COVID Vaccine Patient has not had COVID vaccine.   Social Determinants of Health Food Insecurity: denies food insecurity  Transportation: Patient denies transportation needs. Childcare: Discussed no children allowed at ultrasound appointments.   First visit review I reviewed new OB appt with pt. I explained she will have blood work and pap smear/pelvic exam if indicated. Explained pt will be seen by Guadlupe Spanish, CNM at first visit; encounter routed to appropriate provider.   Donnetta Hail, [facility] 04/22/2023  3:45 PM

## 2023-04-25 ENCOUNTER — Other Ambulatory Visit: Payer: No Typology Code available for payment source

## 2023-04-29 ENCOUNTER — Other Ambulatory Visit (HOSPITAL_COMMUNITY)
Admission: RE | Admit: 2023-04-29 | Discharge: 2023-04-29 | Disposition: A | Discharge status: Home / Self Care | Payer: No Typology Code available for payment source | Source: Ambulatory Visit | Attending: Obstetrics | Admitting: Obstetrics

## 2023-04-29 ENCOUNTER — Ambulatory Visit (INDEPENDENT_AMBULATORY_CARE_PROVIDER_SITE_OTHER): Payer: No Typology Code available for payment source | Admitting: Obstetrics

## 2023-04-29 VITALS — BP 120/76 | HR 86 | Wt 142.0 lb

## 2023-04-29 DIAGNOSIS — F32A Depression, unspecified: Secondary | ICD-10-CM

## 2023-04-29 DIAGNOSIS — Z3A11 11 weeks gestation of pregnancy: Secondary | ICD-10-CM | POA: Diagnosis not present

## 2023-04-29 DIAGNOSIS — O99891 Other specified diseases and conditions complicating pregnancy: Secondary | ICD-10-CM | POA: Insufficient documentation

## 2023-04-29 DIAGNOSIS — Z3481 Encounter for supervision of other normal pregnancy, first trimester: Secondary | ICD-10-CM

## 2023-04-29 DIAGNOSIS — O009 Unspecified ectopic pregnancy without intrauterine pregnancy: Secondary | ICD-10-CM

## 2023-04-29 DIAGNOSIS — Z3480 Encounter for supervision of other normal pregnancy, unspecified trimester: Secondary | ICD-10-CM

## 2023-04-29 DIAGNOSIS — Z113 Encounter for screening for infections with a predominantly sexual mode of transmission: Secondary | ICD-10-CM

## 2023-04-29 DIAGNOSIS — D649 Anemia, unspecified: Secondary | ICD-10-CM

## 2023-04-29 DIAGNOSIS — M549 Dorsalgia, unspecified: Secondary | ICD-10-CM

## 2023-04-29 DIAGNOSIS — Z131 Encounter for screening for diabetes mellitus: Secondary | ICD-10-CM

## 2023-04-29 DIAGNOSIS — Z0184 Encounter for antibody response examination: Secondary | ICD-10-CM

## 2023-04-29 DIAGNOSIS — Z13220 Encounter for screening for lipoid disorders: Secondary | ICD-10-CM

## 2023-04-29 DIAGNOSIS — Z0283 Encounter for blood-alcohol and blood-drug test: Secondary | ICD-10-CM

## 2023-04-29 DIAGNOSIS — Z1379 Encounter for other screening for genetic and chromosomal anomalies: Secondary | ICD-10-CM

## 2023-04-29 DIAGNOSIS — Z720 Tobacco use: Secondary | ICD-10-CM | POA: Insufficient documentation

## 2023-04-29 DIAGNOSIS — T7589XA Other specified effects of external causes, initial encounter: Secondary | ICD-10-CM

## 2023-04-29 MED ORDER — NICOTINE 14 MG/24HR TD PT24: 14.0000 mg | MEDICATED_PATCH | Freq: Every day | TRANSDERMAL | 0 refills | Status: DC

## 2023-04-29 MED ORDER — CYCLOBENZAPRINE HCL 10 MG PO TABS: 10.0000 mg | ORAL_TABLET | Freq: Three times a day (TID) | ORAL | 0 refills | Status: DC | PRN
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
## 2023-04-30 LAB — URINALYSIS, ROUTINE W REFLEX MICROSCOPIC
Bilirubin, UA: NEGATIVE
Glucose, UA: NEGATIVE
Ketones, UA: NEGATIVE
Leukocytes,UA: NEGATIVE
Nitrite, UA: NEGATIVE
Protein,UA: NEGATIVE
RBC, UA: NEGATIVE
Specific Gravity, UA: 1.018 (ref 1.005–1.030)
Urobilinogen, Ur: 0.2 mg/dL (ref 0.2–1.0)
pH, UA: 6 (ref 5.0–7.5)

## 2023-04-30 LAB — HCV INTERPRETATION

## 2023-04-30 LAB — TOXOPLASMA ANTIBODIES- IGG AND  IGM
Toxoplasma Antibody- IgM: <3 [AU]/ml (ref 0.0–7.9)
Toxoplasma IgG Ratio: <3 [IU]/mL (ref 0.0–7.1)

## 2023-04-30 LAB — CBC/D/PLT+RPR+RH+ABO+RUBIGG...
Antibody Screen: NEGATIVE
Basophils Absolute: 0.1 10*3/uL (ref 0.0–0.2)
Basos: 1 %
EOS (ABSOLUTE): 0.3 10*3/uL (ref 0.0–0.4)
Eos: 3 %
HCV Ab: NONREACTIVE
HIV Screen 4th Generation wRfx: NONREACTIVE
Hematocrit: 33.8 % — ABNORMAL LOW (ref 34.0–46.6)
Hemoglobin: 11.6 g/dL (ref 11.1–15.9)
Hepatitis B Surface Ag: NEGATIVE
Immature Grans (Abs): 0 10*3/uL (ref 0.0–0.1)
Immature Granulocytes: 0 %
Lymphocytes Absolute: 2.3 10*3/uL (ref 0.7–3.1)
Lymphs: 23 %
MCH: 31.7 pg (ref 26.6–33.0)
MCHC: 34.3 g/dL (ref 31.5–35.7)
MCV: 92 fL (ref 79–97)
Monocytes Absolute: 1 10*3/uL — ABNORMAL HIGH (ref 0.1–0.9)
Monocytes: 10 %
Neutrophils Absolute: 6.4 10*3/uL (ref 1.4–7.0)
Neutrophils: 63 %
Platelets: 279 10*3/uL (ref 150–450)
RBC: 3.66 x10E6/uL — ABNORMAL LOW (ref 3.77–5.28)
RDW: 12.7 % (ref 11.7–15.4)
RPR Ser Ql: NONREACTIVE
Rh Factor: NEGATIVE
Rubella Antibodies, IGG: <0.9 {index} — ABNORMAL LOW (ref >0.99)
Varicella zoster IgG: REACTIVE
WBC: 10.1 10*3/uL (ref 3.4–10.8)

## 2023-04-30 LAB — TSH: TSH: 2.11 u[IU]/mL (ref 0.450–4.500)

## 2023-04-30 LAB — HEMOGLOBIN A1C
Est. average glucose Bld gHb Est-mCnc: 97 mg/dL
Hgb A1c MFr Bld: 5 % (ref 4.8–5.6)

## 2023-05-01 LAB — URINE CULTURE, OB REFLEX: Organism ID, Bacteria: NO GROWTH

## 2023-05-01 LAB — CULTURE, OB URINE

## 2023-05-05 LAB — MATERNIT 21 PLUS CORE, BLOOD
Fetal Fraction: 19
Result (T21): NEGATIVE
Trisomy 13 (Patau syndrome): NEGATIVE
Trisomy 18 (Edwards syndrome): NEGATIVE
Trisomy 21 (Down syndrome): NEGATIVE

## 2023-05-05 LAB — CYTOLOGY - PAP
Chlamydia: NEGATIVE
Comment: NEGATIVE
Comment: NEGATIVE
Comment: NORMAL
Diagnosis: NEGATIVE
High risk HPV: NEGATIVE
Neisseria Gonorrhea: NEGATIVE

## 2023-05-07 ENCOUNTER — Encounter: Payer: Self-pay | Admitting: Obstetrics

## 2023-06-06 ENCOUNTER — Telehealth: Payer: Self-pay | Admitting: Obstetrics

## 2023-06-06 NOTE — Telephone Encounter (Signed)
 Stephanie Reeves called with concerns for RLQ pain that has been going on for about a 1-1.5 weeks. She denies N/V. She does report constipation although she has had a few small bowel movements. She does not think she has had a fever but did not check her temperature. She is feeling concerned about the worsening pain. Encouraged Tylenol, rest, visit to urgent care if pain not relieved. She will call to schedule her ROB visit.   Stephanie Reeves, CNM

## 2023-06-07 ENCOUNTER — Ambulatory Visit (INDEPENDENT_AMBULATORY_CARE_PROVIDER_SITE_OTHER): Admitting: Certified Nurse Midwife

## 2023-06-07 VITALS — BP 134/81 | HR 80 | Wt 140.9 lb

## 2023-06-07 DIAGNOSIS — Z3A17 17 weeks gestation of pregnancy: Secondary | ICD-10-CM

## 2023-06-07 DIAGNOSIS — Z3482 Encounter for supervision of other normal pregnancy, second trimester: Secondary | ICD-10-CM

## 2023-06-07 NOTE — Progress Notes (Signed)
 PT c/o right lower quadrant pain . Discussed round ligament and self help measures. Dating u/s shows only right ovarian corpus luteum. Pt offered u/s to evaluate, also informed that she would be having u/s for anatomy soon. She states she will wait and follow up with her new practice as she is established with them. Red flag symptoms reviewed. Follow up 3 weeks at new practice or with [place] PRN.   Doreene Burke, CNM

## 2023-06-07 NOTE — Patient Instructions (Signed)
 Round Ligament Pain  The round ligaments are a pair of cord-like tissues that help support the uterus. They can become a source of pain during pregnancy as the ligaments soften and stretch as the baby grows. The pain usually begins in the second trimester (13-28 weeks) of pregnancy, and should only last for a few seconds when it occurs. However, the pain can come and go until the baby is delivered. The pain does not cause harm to the baby. Round ligament pain is usually a short, sharp, and pinching pain, but it can also be a dull, lingering, and aching pain. The pain is felt in the lower side of the abdomen or in the groin. It usually starts deep in the groin and moves up to the outside of the hip area. The pain may happen when you: Suddenly change position, such as quickly going from a sitting to standing position. Do physical activity. Cough or sneeze. Follow these instructions at home: Managing pain  When the pain starts, relax. Then, try any of these methods to help with the pain: Sit down. Flex your knees up to your abdomen. Lie on your side with one pillow under your abdomen and another pillow between your legs. Sit in a warm bath for 15-20 minutes or until the pain goes away. General instructions Watch your condition for any changes. Move slowly when you sit down or stand up. Stop or reduce your physical activities if they cause pain. Avoid long walks if they cause pain. Take over-the-counter and prescription medicines only as told by your health care provider. Keep all follow-up visits. This is important. Contact a health care provider if: Your pain does not go away with treatment. You feel pain in your back that you did not have before. Your medicine is not helping. You have a fever or chills. You have nausea or vomiting. You have diarrhea. You have pain when you urinate. Get help right away if: You have pain that is a rhythmic, cramping pain similar to labor pains. Labor  pains are usually 2 minutes apart, last for about 1 minute, and involve a bearing down feeling or pressure in your pelvis. You have vaginal bleeding. These symptoms may represent a serious problem that is an emergency. Do not wait to see if the symptoms will go away. Get medical help right away. Call your local emergency services (911 in the [place] Do not drive yourself to the hospital. Summary Round ligament pain is felt in the lower abdomen or groin. This pain usually begins in the second trimester (13-28 weeks) and should only last for a few seconds when it occurs. You may notice the pain when you suddenly change position, when you cough or sneeze, or during physical activity. Relaxing, flexing your knees to your abdomen, lying on one side, or taking a warm bath may help to get rid of the pain. Contact your health care provider if the pain does not go away. This information is not intended to replace advice given to you by your health care provider. Make sure you discuss any questions you have with your health care provider. Document Revised: 05/21/2020 Document Reviewed: 05/21/2020 Elsevier Patient Education  2024 [employer].

## 2023-07-05 ENCOUNTER — Ambulatory Visit (INDEPENDENT_AMBULATORY_CARE_PROVIDER_SITE_OTHER)

## 2023-07-05 ENCOUNTER — Other Ambulatory Visit: Payer: Self-pay | Admitting: Certified Nurse Midwife

## 2023-07-05 DIAGNOSIS — Z3A21 21 weeks gestation of pregnancy: Secondary | ICD-10-CM

## 2023-07-05 DIAGNOSIS — Z363 Encounter for antenatal screening for malformations: Secondary | ICD-10-CM | POA: Diagnosis not present

## 2023-07-05 DIAGNOSIS — Z3A17 17 weeks gestation of pregnancy: Secondary | ICD-10-CM

## 2023-07-14 ENCOUNTER — Ambulatory Visit (INDEPENDENT_AMBULATORY_CARE_PROVIDER_SITE_OTHER)

## 2023-07-14 VITALS — BP 133/84 | HR 101 | Wt 156.3 lb

## 2023-07-14 DIAGNOSIS — F17290 Nicotine dependence, other tobacco product, uncomplicated: Secondary | ICD-10-CM | POA: Diagnosis not present

## 2023-07-14 DIAGNOSIS — Z720 Tobacco use: Secondary | ICD-10-CM

## 2023-07-14 DIAGNOSIS — Z6791 Unspecified blood type, Rh negative: Secondary | ICD-10-CM | POA: Insufficient documentation

## 2023-07-14 DIAGNOSIS — O99332 Smoking (tobacco) complicating pregnancy, second trimester: Secondary | ICD-10-CM

## 2023-07-14 DIAGNOSIS — Z3480 Encounter for supervision of other normal pregnancy, unspecified trimester: Secondary | ICD-10-CM

## 2023-07-14 DIAGNOSIS — Z3A22 22 weeks gestation of pregnancy: Secondary | ICD-10-CM

## 2023-07-14 NOTE — Progress Notes (Signed)
    Return Prenatal Note   Assessment/Plan   Plan  33 y.o. G3P1011 at [redacted]w[redacted]d presents for follow-up OB visit. Reviewed prenatal record including previous visit note.  Supervision of other normal pregnancy, antepartum Doing well overall, no concerns today. Still attempting to find new provider closer to their new home in [place]. Encouraged pt to make appointments with this office in case she is unable to find new provider.  Anatomy scan completed 4/15, normal.  Anticipatory guidance provided to patient and partner also in the room.   Nicotine  use Pt has been using prescribed Nicotine  patched but states they do not work for her and often fall off. She has been trying to cut back and quit vaping. Re-enforced recommendation to stop nicotine  use during pregnancy and encouraged pt to reach out if she desires further assistance in quitting.    No orders of the defined types were placed in this encounter.  Return in about 4 weeks (around 08/11/2023) for ROB with 1 hour glucola.   Future Appointments  Date Time Provider Department Center  08/18/2023  9:40 AM [facility] None  08/18/2023 10:15 AM Dominic, Alva Jewels, CNM [facility] None    For next visit:  continue with routine prenatal care     Subjective   33 y.o. G3P1011 at [redacted]w[redacted]d presents for this follow-up prenatal visit.  Patient doing well overall with no acute concerns.  Patient reports: Movement: Present Contractions: Not present  Objective   Flow sheet Vitals: Pulse Rate: (!) 101 BP: 133/84 Fundal Height: 20 cm Fetal Heart Rate (bpm): 154 Total weight gain: 9.662 kg  General Appearance  No acute distress, well appearing, and well nourished Pulmonary   Normal work of breathing Neurologic   Alert and oriented to person, place, and time Psychiatric   Mood and affect within normal limits  Laquita Plant, Student-MidWife  07/14/2510:01 PM

## 2023-07-14 NOTE — Assessment & Plan Note (Signed)
 Doing well overall, no concerns today. Still attempting to find new provider closer to their new home in [place]. Encouraged pt to make appointments with this office in case she is unable to find new provider.  Anatomy scan completed 4/15, normal.  Anticipatory guidance provided to patient and partner also in the room.

## 2023-07-14 NOTE — Assessment & Plan Note (Signed)
 Pt has been using prescribed Nicotine  patched but states they do not work for her and often fall off. She has been trying to cut back and quit vaping. Re-enforced recommendation to stop nicotine  use during pregnancy and encouraged pt to reach out if she desires further assistance in quitting.

## 2023-08-05 ENCOUNTER — Other Ambulatory Visit: Payer: Self-pay

## 2023-08-05 DIAGNOSIS — Z2913 Encounter for prophylactic Rho(D) immune globulin: Secondary | ICD-10-CM

## 2023-08-05 DIAGNOSIS — Z113 Encounter for screening for infections with a predominantly sexual mode of transmission: Secondary | ICD-10-CM

## 2023-08-05 DIAGNOSIS — Z131 Encounter for screening for diabetes mellitus: Secondary | ICD-10-CM

## 2023-08-05 DIAGNOSIS — D649 Anemia, unspecified: Secondary | ICD-10-CM

## 2023-08-18 ENCOUNTER — Encounter: Admitting: Licensed Practical Nurse

## 2023-08-18 ENCOUNTER — Other Ambulatory Visit

## 2023-08-22 ENCOUNTER — Other Ambulatory Visit

## 2023-08-22 ENCOUNTER — Encounter: Payer: Self-pay | Admitting: Licensed Practical Nurse

## 2023-08-22 ENCOUNTER — Ambulatory Visit (INDEPENDENT_AMBULATORY_CARE_PROVIDER_SITE_OTHER): Admitting: Licensed Practical Nurse

## 2023-08-22 VITALS — BP 122/82 | HR 103 | Wt 178.0 lb

## 2023-08-22 DIAGNOSIS — Z3A28 28 weeks gestation of pregnancy: Secondary | ICD-10-CM

## 2023-08-22 DIAGNOSIS — Z2913 Encounter for prophylactic Rho(D) immune globulin: Secondary | ICD-10-CM

## 2023-08-22 DIAGNOSIS — Z23 Encounter for immunization: Secondary | ICD-10-CM

## 2023-08-22 DIAGNOSIS — Z113 Encounter for screening for infections with a predominantly sexual mode of transmission: Secondary | ICD-10-CM

## 2023-08-22 DIAGNOSIS — D649 Anemia, unspecified: Secondary | ICD-10-CM

## 2023-08-22 DIAGNOSIS — Z6791 Unspecified blood type, Rh negative: Secondary | ICD-10-CM

## 2023-08-22 DIAGNOSIS — O360130 Maternal care for anti-D [Rh] antibodies, third trimester, not applicable or unspecified: Secondary | ICD-10-CM

## 2023-08-22 DIAGNOSIS — Z131 Encounter for screening for diabetes mellitus: Secondary | ICD-10-CM

## 2023-08-22 DIAGNOSIS — Z3480 Encounter for supervision of other normal pregnancy, unspecified trimester: Secondary | ICD-10-CM

## 2023-08-22 LAB — POCT URINALYSIS DIPSTICK
Bilirubin, UA: NEGATIVE
Blood, UA: NEGATIVE
Glucose, UA: NEGATIVE
Ketones, UA: NEGATIVE
Leukocytes, UA: NEGATIVE
Nitrite, UA: NEGATIVE
Protein, UA: NEGATIVE
Spec Grav, UA: 1.02 (ref 1.010–1.025)
Urobilinogen, UA: 0.2 U/dL
pH, UA: 7 (ref 5.0–8.0)

## 2023-08-22 MED ORDER — RHO D IMMUNE GLOBULIN 1500 UNIT/2ML IJ SOSY
300.0000 ug | PREFILLED_SYRINGE | Freq: Once | INTRAMUSCULAR | Status: AC
Start: 2023-08-22 — End: 2023-08-22
  Administered 2023-08-22: 300 ug via INTRAMUSCULAR

## 2023-08-22 NOTE — Progress Notes (Signed)
    Return Prenatal Note   Subjective   33 y.o. G3P1011 at [redacted]w[redacted]d presents for this follow-up prenatal visit.  Patient Here with her boyfriend Stephanie Reeves. Doing well. Having heartburn-uses TUMS. Hs had leg cramps. Notices swelling after works. Works at an assembly plant.-comfort measures reviewed. Plans to Breastfeed, only breastfed for 3 weeks with her first child. Has not established care with a provider in [place] yet.  Patient reports: Movement: Present Contractions: Irritability  Objective   Flow sheet Vitals: Pulse Rate: (!) 103 BP: 122/82 Fundal Height: 28 cm Fetal Heart Rate (bpm): 150 Total weight gain: 43 lb (19.5 kg)  General Appearance  No acute distress, well appearing, and well nourished Pulmonary   Normal work of breathing Neurologic   Alert and oriented to person, place, and time Psychiatric   Mood and affect within normal limits  Assessment/Plan   Plan  33 y.o. G3P1011 at [redacted]w[redacted]d presents for follow-up OB visit. Reviewed prenatal record including previous visit note.  Rh negative state in antepartum period -Rhogam given today      Orders Placed This Encounter  Procedures   Tdap vaccine greater than or equal to 7yo IM   POCT Urinalysis Dipstick   Return in about 2 weeks (around 09/05/2023) for ROB.   Future Appointments  Date Time Provider Department Center  09/05/2023  9:55 AM Slaughterbeck, Sherline Distel, CNM [facility] None    For next visit:  continue with routine prenatal care     Berkley Breech [facility], CNM  06/02/252:41 PM

## 2023-08-22 NOTE — Assessment & Plan Note (Signed)
 Rhogam given today

## 2023-08-22 NOTE — Assessment & Plan Note (Signed)
-  28wk labs -TDAP given  -Comfort measures for heartburn reviewed -Warning signs reviewed

## 2023-08-23 LAB — 28 WEEKS RH-PANEL
Antibody Screen: NEGATIVE
Basophils Absolute: 0.1 10*3/uL (ref 0.0–0.2)
Basos: 0 %
EOS (ABSOLUTE): 0.2 10*3/uL (ref 0.0–0.4)
Eos: 1 %
Gestational Diabetes Screen: 81 mg/dL (ref 70–139)
HIV Screen 4th Generation wRfx: NONREACTIVE
Hematocrit: 31.9 % — ABNORMAL LOW (ref 34.0–46.6)
Hemoglobin: 10.5 g/dL — ABNORMAL LOW (ref 11.1–15.9)
Immature Grans (Abs): 0.2 10*3/uL — ABNORMAL HIGH (ref 0.0–0.1)
Immature Granulocytes: 2 %
Lymphocytes Absolute: 1.9 10*3/uL (ref 0.7–3.1)
Lymphs: 13 %
MCH: 32.3 pg (ref 26.6–33.0)
MCHC: 32.9 g/dL (ref 31.5–35.7)
MCV: 98 fL — ABNORMAL HIGH (ref 79–97)
Monocytes Absolute: 1.3 10*3/uL — ABNORMAL HIGH (ref 0.1–0.9)
Monocytes: 9 %
Neutrophils Absolute: 10.8 10*3/uL — ABNORMAL HIGH (ref 1.4–7.0)
Neutrophils: 75 %
Platelets: 327 10*3/uL (ref 150–450)
RBC: 3.25 x10E6/uL — ABNORMAL LOW (ref 3.77–5.28)
RDW: 13.1 % (ref 11.7–15.4)
RPR Ser Ql: NONREACTIVE
WBC: 14.4 10*3/uL — ABNORMAL HIGH (ref 3.4–10.8)

## 2023-08-24 ENCOUNTER — Ambulatory Visit: Payer: Self-pay | Admitting: Licensed Practical Nurse

## 2023-09-01 NOTE — Progress Notes (Signed)
    Return Prenatal Note   Subjective   33 y.o. G3P1011 at [redacted]w[redacted]d presents for this follow-up prenatal visit.  Patient continued discomfort with heart burn despite using TUMs.  Patient reports: Movement: Present Contractions: Irritability  Objective   Flow sheet Vitals: Pulse Rate: (!) 107 BP: 117/74 Fundal Height: 30 cm Fetal Heart Rate (bpm): 140 Total weight gain: 46 lb 1.6 oz (20.9 kg)  General Appearance  No acute distress, well appearing, and well nourished Pulmonary   Normal work of breathing Neurologic   Alert and oriented to person, place, and time Psychiatric   Mood and affect within normal limits  Assessment/Plan   Plan  33 y.o. G3P1011 at [redacted]w[redacted]d presents for follow-up OB visit. Reviewed prenatal record including previous visit note.  Supervision of other normal pregnancy, antepartum Pepcid sent to pharmacy to help with GERD that is worse during work. She is currently working 3rd shift. Will take prior to going to work. Can add am dose as needed Has not found [facility] provider yet. Did have a fast first labor. Reviewed closest hospital to her in [place] which would be [facility]. Will attempt to transfer prenatal care before 36 weeks.  Reviewed kick counts and preterm labor warning signs. Instructed to call office or come to hospital with persistent headache, vision changes, regular contractions, leaking of fluid, decreased fetal movement or vaginal bleeding.       No orders of the defined types were placed in this encounter.  Return in about 4 weeks (around 10/03/2023) for ROB.   Future Appointments  Date Time Provider Department Center  09/19/2023 10:35 AM Mela Perham, Sherline Distel, CNM [facility] None    For next visit:  continue with routine prenatal care     Donato Fu, CNM [facility] 09/04/2509:13 AM

## 2023-09-05 ENCOUNTER — Ambulatory Visit (INDEPENDENT_AMBULATORY_CARE_PROVIDER_SITE_OTHER): Admitting: Certified Nurse Midwife

## 2023-09-05 VITALS — BP 117/74 | HR 107 | Wt 181.1 lb

## 2023-09-05 DIAGNOSIS — Z3480 Encounter for supervision of other normal pregnancy, unspecified trimester: Secondary | ICD-10-CM | POA: Diagnosis not present

## 2023-09-05 MED ORDER — FAMOTIDINE 20 MG PO TABS: 20.0000 mg | ORAL_TABLET | Freq: Two times a day (BID) | ORAL | 3 refills | Status: DC

## 2023-09-05 NOTE — Assessment & Plan Note (Signed)
 Pepcid sent to pharmacy to help with GERD that is worse during work. She is currently working 3rd shift. Will take prior to going to work. Can add am dose as needed Has not found [facility] provider yet. Did have a fast first labor. Reviewed closest hospital to her in [place] which would be [facility]. Will attempt to transfer prenatal care before 36 weeks.  Reviewed kick counts and preterm labor warning signs. Instructed to call office or come to hospital with persistent headache, vision changes, regular contractions, leaking of fluid, decreased fetal movement or vaginal bleeding.

## 2023-09-16 NOTE — Progress Notes (Deleted)
    Return Prenatal Note   Subjective   33 y.o. G3P1011 at [redacted]w[redacted]d presents for this follow-up prenatal visit.  Patient *** Patient reports:    Objective   Flow sheet Vitals:   Total weight gain: 46 lb 1.6 oz (20.9 kg)  General Appearance  No acute distress, well appearing, and well nourished Pulmonary   Normal work of breathing Neurologic   Alert and oriented to person, place, and time Psychiatric   Mood and affect within normal limits  Assessment/Plan   Plan  33 y.o. G3P1011 at [redacted]w[redacted]d presents for follow-up OB visit. Reviewed prenatal record including previous visit note.  No problem-specific Assessment & Plan notes found for this encounter.      No orders of the defined types were placed in this encounter.  No follow-ups on file.   Future Appointments  Date Time Provider Department Center  09/19/2023 10:35 AM Slaughterbeck, Damien, CNM [facility]OB-[facility] None    For next visit:  {[email or web address]}     .

## 2023-09-19 ENCOUNTER — Encounter: Admitting: Certified Nurse Midwife

## 2023-09-20 ENCOUNTER — Encounter: Payer: Self-pay | Admitting: Certified Nurse Midwife

## 2023-09-30 ENCOUNTER — Other Ambulatory Visit (HOSPITAL_COMMUNITY)
Admission: RE | Admit: 2023-09-30 | Discharge: 2023-09-30 | Disposition: A | Discharge status: Home / Self Care | Source: Ambulatory Visit | Attending: Certified Nurse Midwife | Admitting: Certified Nurse Midwife

## 2023-09-30 ENCOUNTER — Encounter: Payer: Self-pay | Admitting: Certified Nurse Midwife

## 2023-09-30 ENCOUNTER — Ambulatory Visit (INDEPENDENT_AMBULATORY_CARE_PROVIDER_SITE_OTHER): Admitting: Certified Nurse Midwife

## 2023-09-30 VITALS — BP 115/82 | HR 95 | Wt 184.6 lb

## 2023-09-30 DIAGNOSIS — Z3A33 33 weeks gestation of pregnancy: Secondary | ICD-10-CM

## 2023-09-30 DIAGNOSIS — Z3480 Encounter for supervision of other normal pregnancy, unspecified trimester: Secondary | ICD-10-CM

## 2023-09-30 DIAGNOSIS — N898 Other specified noninflammatory disorders of vagina: Secondary | ICD-10-CM | POA: Diagnosis present

## 2023-09-30 DIAGNOSIS — O26893 Other specified pregnancy related conditions, third trimester: Secondary | ICD-10-CM | POA: Insufficient documentation

## 2023-09-30 DIAGNOSIS — Z113 Encounter for screening for infections with a predominantly sexual mode of transmission: Secondary | ICD-10-CM | POA: Diagnosis present

## 2023-09-30 DIAGNOSIS — Z369 Encounter for antenatal screening, unspecified: Secondary | ICD-10-CM | POA: Diagnosis present

## 2023-09-30 DIAGNOSIS — Z3483 Encounter for supervision of other normal pregnancy, third trimester: Secondary | ICD-10-CM | POA: Diagnosis not present

## 2023-09-30 NOTE — Progress Notes (Unsigned)
    Return Prenatal Note   Subjective   33 y.o. G3P1011 at [redacted]w[redacted]d presents for this follow-up prenatal visit.  Patient feeling well, active baby, reports increased vaginal discharge, self swab collected. Patient reports: Movement: Present Contractions: Irritability  Objective   Flow sheet Vitals: Pulse Rate: 95 BP: 115/82 Fundal Height: 34 cm Fetal Heart Rate (bpm): 140 Total weight gain: 49 lb 9.6 oz (22.5 kg)  General Appearance  No acute distress, well appearing, and well nourished Pulmonary   Normal work of breathing Neurologic   Alert and oriented to person, place, and time Psychiatric   Mood and affect within normal limits  Assessment/Plan   Plan  33 y.o. G3P1011 at [redacted]w[redacted]d presents for follow-up OB visit. Reviewed prenatal record including previous visit note.  Supervision of other normal pregnancy, antepartum Reviewed kick counts and preterm labor warning signs. Instructed to call office or come to hospital with persistent headache, vision changes, regular contractions, leaking of fluid, decreased fetal movement or vaginal bleeding. Information provided about [facility] with delivery at [facility], ROI completed, Geanna will call to schedule NOB tx appointment.      No orders of the defined types were placed in this encounter.  Return in 2 weeks (on 10/14/2023) for ROB.   No future appointments.  For next visit:  ROB with GBS screening , Aptima collected today     Harlene LITTIE [employer], CNM  07/11/258:48 AM

## 2023-09-30 NOTE — Patient Instructions (Signed)
 Third Trimester of Pregnancy  The third trimester of pregnancy is from week 28 through week 40. This is months 7 through 9. The third trimester is a time when your baby is growing fast. Body changes during your third trimester Your body continues to change during this time. The changes usually go away after your baby is born. Physical changes You will continue to gain weight. You may get stretch marks on your hips, belly, and breasts. Your breasts will keep growing and may hurt. A yellow fluid (colostrum) may leak from your breasts. This is the first milk you're making for your baby. Your hair may grow faster and get thicker. In some cases, you may get hair loss. Your belly button may stick out. You may have more swelling in your hands, face, or ankles. Health changes You may have heartburn. You may feel short of breath. This is caused by the uterus that is now bigger. You may have more aches in the pelvis, back, or thighs. You may have more tingling or numbness in your hands, arms, and legs. You may pee more often. You may have trouble pooping (constipation) or swollen veins in the butt that can itch or get painful (hemorrhoids). Other changes You may have more problems sleeping. You may notice the baby moving lower in your belly (dropping). You may have more fluid coming from your vagina. Your joints may feel loose, and you may have pain around your pelvic bone. Follow these instructions at home: Medicines Take medicines only as told by your health care provider. Some medicines are not safe during pregnancy. Your provider may change the medicines that you take. Do not take any medicines unless told to by your provider. Take a prenatal vitamin that has at least 600 micrograms (mcg) of folic acid. Do not use herbal medicines, illegal drugs, or medicines that are not approved by your provider. Eating and drinking While you're pregnant your body needs additional nutrition to help  support your growing baby. Talk with your provider about your nutritional needs. Activity Most women are able to exercise regularly during pregnancy. Exercise routines may need to change at the end of your pregnancy. Talk to your provider about your activities and exercise routine. Relieving pain and discomfort Rest often with your legs raised if you have leg cramps or low back pain. Take warm sitz baths to soothe pain from hemorrhoids. Use hemorrhoid cream if your provider says it's okay. Wear a good, supportive bra if your breasts hurt. Do not use hot tubs, steam rooms, or saunas. Do not douche. Do not use tampons or scented pads. Safety Talk to your provider before traveling far distances. Wear your seatbelt at all times when you're in a car. Talk to your provider if someone hits you, hurts you, or yells at you. Preparing for birth To prepare for your baby: Take childbirth and breastfeeding classes. Visit the hospital and tour the maternity area. Buy a rear-facing car seat. Learn how to install it in your car. General instructions Avoid cat litter boxes and soil used by cats. These things carry germs that can cause harm to your pregnancy and your baby. Do not drink alcohol, smoke, vape, or use products with nicotine or tobacco in them. If you need help quitting, talk with your provider. Keep all follow-up visits for your third trimester. Your provider will do more exams and tests during this trimester. Write down your questions. Take them to your prenatal visits. Your provider also will: Talk with you about  your overall health. Give you advice or refer you to specialists who can help with different needs, including: Mental health and counseling. Foods and healthy eating. Ask for help if you need help with food. Where to find more information American Pregnancy Association: americanpregnancy.org [employer]ians [employer] Gynecologists: acog.org Office on [employer] Health:  [employer] Contact a health care provider if: You have a headache that does not go away when you take medicine. You have any of these problems: You can't eat or drink. You have nausea and vomiting. You have watery poop (diarrhea) for 2 days or more. You have pain when you pee, or your pee smells bad. You have been sick for 2 days or more and aren't getting better. Contact your provider right away if: You have any of these coming from your vagina: Abnormal discharge. Bad-smelling fluid. Bleeding. Your baby is moving less than usual. You have signs of labor: You have any contractions, belly cramping, or have pain in your pelvis or lower back before 37 weeks of pregnancy (preterm labor). You have regular contractions that are less than 5 minutes apart. Your water breaks. You have symptoms of high blood pressure or preeclampsia. These include: A severe, throbbing headache that does not go away. Sudden or extreme swelling of your face, hands, legs, or feet. Vision problems: You see spots. You have blurry vision. Your eyes are sensitive to light. If you can't reach your provider, go to an urgent care or emergency room. Get help right away if: You faint, become confused, or can't think clearly. You have chest pain or trouble breathing. You have any kind of injury, such as from a fall or a car crash. These symptoms may be an emergency. Call 911 right away. Do not wait to see if the symptoms will go away. Do not drive yourself to the hospital. This information is not intended to replace advice given to you by your health care provider. Make sure you discuss any questions you have with your health care provider. Document Revised: 12/09/2022 Document Reviewed: 07/09/2022 Elsevier Patient Education  2024 [employer].

## 2023-10-02 NOTE — Assessment & Plan Note (Signed)
 Reviewed kick counts and preterm labor warning signs. Instructed to call office or come to hospital with persistent headache, vision changes, regular contractions, leaking of fluid, decreased fetal movement or vaginal bleeding. Information provided about [facility] with delivery at [facility], ROI completed, Annelie will call to schedule NOB tx appointment.

## 2023-10-03 ENCOUNTER — Encounter: Payer: Self-pay | Admitting: Certified Nurse Midwife

## 2023-10-03 LAB — CERVICOVAGINAL ANCILLARY ONLY
Bacterial Vaginitis (gardnerella): NEGATIVE
Candida Glabrata: NEGATIVE
Candida Vaginitis: NEGATIVE
Chlamydia: NEGATIVE
Comment: NEGATIVE
Comment: NEGATIVE
Comment: NEGATIVE
Comment: NEGATIVE
Comment: NEGATIVE
Comment: NORMAL
Neisseria Gonorrhea: NEGATIVE
Trichomonas: NEGATIVE

## 2023-10-04 ENCOUNTER — Ambulatory Visit: Payer: Self-pay | Admitting: Certified Nurse Midwife

## 2023-10-17 ENCOUNTER — Encounter: Payer: Self-pay | Admitting: Licensed Practical Nurse

## 2023-10-17 ENCOUNTER — Ambulatory Visit (INDEPENDENT_AMBULATORY_CARE_PROVIDER_SITE_OTHER): Admitting: Licensed Practical Nurse

## 2023-10-17 VITALS — BP 119/73 | HR 88 | Wt 181.6 lb

## 2023-10-17 DIAGNOSIS — Z3480 Encounter for supervision of other normal pregnancy, unspecified trimester: Secondary | ICD-10-CM

## 2023-10-17 DIAGNOSIS — Z3685 Encounter for antenatal screening for Streptococcus B: Secondary | ICD-10-CM

## 2023-10-17 DIAGNOSIS — Z3483 Encounter for supervision of other normal pregnancy, third trimester: Secondary | ICD-10-CM

## 2023-10-17 DIAGNOSIS — Z3A36 36 weeks gestation of pregnancy: Secondary | ICD-10-CM

## 2023-10-17 NOTE — Progress Notes (Signed)
    Return Prenatal Note   Subjective   33 y.o. G3P1011 at [redacted]w[redacted]d presents for this follow-up prenatal visit.  Patient here with her partner and her 15y/o daughter.  Patient reports: Doing ok, starting to get achy and swollen at the end of her shifts-wonders when she should stop working-rec working until her due date, try compression socks and a support belt.  -Had an episode of dizziness yesterday, it did not last long and did not pass out. Reassured this can be normal especially in the heat-rec increasing hydration and use electrolytes.   Movement: Present Contractions: Irritability  Objective   Flow sheet Vitals: Pulse Rate: 88 BP: 119/73 Fundal Height: 36 cm Fetal Heart Rate (bpm): 140 Total weight gain: 46 lb 9.6 oz (21.1 kg)  General Appearance  No acute distress, well appearing, and well nourished Pulmonary   Normal work of breathing Neurologic   Alert and oriented to person, place, and time Psychiatric   Mood and affect within normal limits  Assessment/Plan   Plan  33 y.o. G3P1011 at [redacted]w[redacted]d presents for follow-up OB visit. Reviewed prenatal record including previous visit note.  Supervision of other normal pregnancy, antepartum -unable to transfer to [facility], plans to give birth in [place], reassured she will get good care no matter where she goes.  -needs peds -GBS collected, gc/ct collected 2 weeks ago -[facility] by [facility] -her partner and daughter will be her labor support  -warning signs reviewed      Orders Placed This Encounter  Procedures   Culture, beta strep (group b only)   Return in about 1 week (around 10/24/2023) for ROB.   Future Appointments  Date Time Provider Department Center  10/24/2023  4:15 PM Slaughterbeck, Damien, CNM [facility] None  11/03/2023  3:55 PM Leigh Sober, MD [facility] None    For next visit:  continue with routine prenatal care     JINNIE HERO [facility], CNM  07/28/254:16 PM

## 2023-10-17 NOTE — Assessment & Plan Note (Addendum)
-  unable to transfer to [facility], plans to give birth in [place], reassured she will get good care no matter where she goes.  -needs peds -GBS collected, gc/ct collected 2 weeks ago -[facility] by [facility] -her partner and daughter will be her labor support  -warning signs reviewed

## 2023-10-17 NOTE — Patient Instructions (Signed)
 Group B Streptococcus Test During Pregnancy Why am I having this test? Routine testing, also called screening, for group B streptococcus (GBS) is recommended for all pregnant women between the 36th and 37th week of pregnancy. GBS is a type of bacteria that can be passed from mother to baby during childbirth. Screening will help guide whether or not you will need treatment during labor and delivery to prevent complications such as: An infection in your uterus during labor. An infection in your uterus after delivery. A serious infection in your baby after delivery, such as pneumonia, meningitis, or sepsis. GBS screening is not often done before 36 weeks of pregnancy unless you go into labor prematurely. What happens if I have group B streptococcus? If testing shows that you have GBS, your health care provider will recommend treatment with IV antibiotics during labor and delivery. This treatment significantly decreases the risk of complications for you and your baby. If you have a planned C-section and you have GBS, you may not need to be treated with antibiotics because GBS is usually passed to babies after labor starts and your water breaks. If you are in labor or your water breaks before your C-section, it is possible for GBS to get into your uterus and be passed to your baby, so you might need treatment. Is there a chance I may not need to be tested? You may not need to be tested for GBS if: You have a urine test that shows GBS before 36 to 37 weeks. You had a baby with GBS infection after a previous delivery. In these cases, you will automatically be treated for GBS during labor and delivery. What is being tested? This test is done to check if you have group B streptococcus in your vagina or rectum. What kind of sample is taken? To collect samples for this test, your health care provider will swab your vagina and rectum with a cotton swab. The sample is then sent to the lab to see if GBS is  present. What happens during the test?  You will remove your clothing from the waist down. You will lie down on an exam table in the same position as you would for a pelvic exam. Your health care provider will swab your vagina and rectum to collect samples for a culture test. You will be able to go home after the test and do all your usual activities. How are the results reported? The test results are reported as positive or negative. What do the results mean? A positive test means you are at risk for passing GBS to your baby during labor and delivery. Your health care provider will recommend that you are treated with an IV antibiotic during labor and delivery. A negative test means you are at very low risk of passing GBS to your baby. There is still a low risk of passing GBS to your baby because sometimes test results may report that you do not have a condition when you do (false-negative result) or there is a chance that you may become infected with GBS after the test is done. You most likely will not need to be treated with an antibiotic during labor and delivery. Talk with your health care provider about what your results mean. Questions to ask your health care provider Ask your health care provider, or the department that is doing the test: When will my results be ready? How will I get my results? What are my treatment options? Summary Routine testing (screening) for  group B streptococcus (GBS) is recommended for all pregnant women between the 36th and 37th week of pregnancy. GBS is a type of bacteria that can be passed from mother to baby during childbirth. If testing shows that you have GBS, your health care provider will recommend that you are treated with IV antibiotics during labor and delivery. This treatment almost always prevents infection in newborns. This information is not intended to replace advice given to you by your health care provider. Make sure you discuss any questions  you have with your health care provider. Document Revised: 02/22/2022 Document Reviewed: 02/22/2022 Elsevier Patient Education  2024 [employer].Signs and Symptoms of Labor Labor is the body's natural process of moving the baby and the placenta out of the uterus. The process of labor usually starts when the baby is full-term, between 48 and 41 weeks of pregnancy. Signs and symptoms that you are close to going into labor As your body prepares for labor and the birth of your baby, you may notice the following symptoms in the weeks and days before true labor starts: Passing a small amount of thick, bloody mucus from your vagina. This is called normal bloody show or losing your mucus plug. This may happen more than a week before labor begins, or right before labor begins, as the opening of the cervix starts to widen (dilate). For some women, the entire mucus plug passes at once. For others, pieces of the mucus plug may gradually pass over several days. Your baby moving (dropping) lower in your pelvis to get into position for birth (lightening). When this happens, you may feel more pressure on your bladder and pelvic bone and less pressure on your ribs. This may make it easier to breathe. It may also cause you to need to urinate more often and have problems with bowel movements. Having practice contractions, also called Braxton Hicks contractions or false labor. These occur at irregular (unevenly spaced) intervals that are more than 10 minutes apart. False labor contractions are common after exercise or sexual activity. They will stop if you change position, rest, or drink fluids. These contractions are usually mild and do not get stronger over time. They may feel like: A backache or back pain. Mild cramps, similar to menstrual cramps. Tightening or pressure in your abdomen. Other early symptoms include: Nausea or loss of appetite. Diarrhea. Having a sudden burst of energy, or feeling very tired. Mood  changes. Having trouble sleeping. Signs and symptoms that labor has begun Signs that you are in labor may include: Having contractions that come at regular (evenly spaced) intervals and increase in intensity. This may feel like more intense tightening or pressure in your abdomen that moves to your back. Contractions may also feel like rhythmic pain in your upper thighs or back that comes and goes at regular intervals. If you are delivering for the first time, this change in intensity of contractions often occurs at a more gradual pace. If you have given birth before, you may notice a more rapid progression of contraction changes. Feeling pressure in the vaginal area. Your water breaking (rupture of membranes). This is when the sac of fluid that surrounds your baby breaks. Fluid leaking from your vagina may be clear or blood-tinged. Labor usually starts within 24 hours of your water breaking, but it may take longer to begin. Some people may feel a sudden gush of fluid; others may notice repeatedly damp underwear. Follow these instructions at home:  When labor starts, or if your  water breaks, call your health care provider or nurse care line. Based on your situation, they will determine when you should go in for an exam. During early labor, you may be able to rest and manage symptoms at home. Some strategies to try at home include: Breathing and relaxation techniques. Taking a warm bath or shower. Listening to music. Using a heating pad on the lower back for pain. If directed, apply heat to the area as often as told by your health care provider. Use the heat source that your health care provider recommends, such as a moist heat pack or a heating pad. Place a towel between your skin and the heat source. Leave the heat on for 20-30 minutes. Remove the heat if your skin turns bright red. This is especially important if you are unable to feel pain, heat, or cold. You have a greater risk of getting  burned. Contact a health care provider if: Your labor has started. Your water breaks. You have nausea, vomiting, or diarrhea. Get help right away if: You have painful, regular contractions that are 5 minutes apart or less. Labor starts before you are [redacted] weeks along in your pregnancy. You have a fever. You have bright red blood coming from your vagina. You do not feel your baby moving. You have a severe headache with or without vision problems. You have chest pain or shortness of breath. These symptoms may represent a serious problem that is an emergency. Do not wait to see if the symptoms will go away. Get medical help right away. Call your local emergency services (911 in the [place] Do not drive yourself to the hospital. Summary Labor is your body's natural process of moving your baby and the placenta out of your uterus. The process of labor usually starts when your baby is full-term, between 18 and 40 weeks of pregnancy. When labor starts, or if your water breaks, call your health care provider or nurse care line. Based on your situation, they will determine when you should go in for an exam. This information is not intended to replace advice given to you by your health care provider. Make sure you discuss any questions you have with your health care provider. Document Revised: 07/22/2020 Document Reviewed: 07/22/2020 Elsevier Patient Education  2024 [employer].

## 2023-10-21 LAB — CULTURE, BETA STREP (GROUP B ONLY): Strep Gp B Culture: NEGATIVE

## 2023-10-24 ENCOUNTER — Ambulatory Visit (INDEPENDENT_AMBULATORY_CARE_PROVIDER_SITE_OTHER): Admitting: Certified Nurse Midwife

## 2023-10-24 ENCOUNTER — Encounter: Payer: Self-pay | Admitting: Certified Nurse Midwife

## 2023-10-24 VITALS — BP 126/79 | HR 99 | Wt 178.8 lb

## 2023-10-24 DIAGNOSIS — Z3480 Encounter for supervision of other normal pregnancy, unspecified trimester: Secondary | ICD-10-CM | POA: Diagnosis not present

## 2023-10-24 MED ORDER — ONDANSETRON 4 MG PO TBDP: 4.0000 mg | ORAL_TABLET | Freq: Three times a day (TID) | ORAL | 0 refills | Status: AC | PRN

## 2023-10-24 MED ORDER — VITAFUSION FIBER WELL 2.5 G PO CHEW: 1.0000 | CHEWABLE_TABLET | Freq: Every day | ORAL | 2 refills | Status: AC

## 2023-10-24 NOTE — Progress Notes (Signed)
    Return Prenatal Note   Subjective   34 y.o. G3P1011 at [redacted]w[redacted]d presents for this follow-up prenatal visit.  Patient is doing well. She has been having lower pelvic pressure and B.H Patient reports: Movement: Present Contractions: Regular  Objective   Flow sheet Vitals: Pulse Rate: 99 BP: 126/79 Fundal Height: 37 cm Fetal Heart Rate (bpm): 130 Total weight gain: 43 lb 12.8 oz (19.9 kg)  General Appearance  No acute distress, well appearing, and well nourished Pulmonary   Normal work of breathing Neurologic   Alert and oriented to person, place, and time Psychiatric   Mood and affect within normal limits  Assessment/Plan   Plan  33 y.o. G3P1011 at [redacted]w[redacted]d presents for follow-up OB visit. Reviewed prenatal record including previous visit note.  Supervision of other normal pregnancy, antepartum Feeling some increased nausea. Zofran  sent.  Reviewed labor warning signs and expectations for birth. Instructed to call office or come to hospital with persistent headache, vision changes, regular contractions, leaking of fluid, decreased fetal movement or vaginal bleeding.       No orders of the defined types were placed in this encounter.  No follow-ups on file.   Future Appointments  Date Time Provider Department Center  11/03/2023  3:55 PM Leigh Sober, MD [facility] None  11/08/2023 10:15 AM Leigh Sober, MD [facility] None    For next visit:  continue with routine prenatal care     Damien Parsley, CNM [facility] 08/04/257:38 PM

## 2023-10-24 NOTE — Assessment & Plan Note (Signed)
 Feeling some increased nausea. Zofran  sent.  Reviewed labor warning signs and expectations for birth. Instructed to call office or come to hospital with persistent headache, vision changes, regular contractions, leaking of fluid, decreased fetal movement or vaginal bleeding.

## 2023-11-03 ENCOUNTER — Encounter: Payer: Self-pay | Admitting: Obstetrics

## 2023-11-03 ENCOUNTER — Ambulatory Visit (INDEPENDENT_AMBULATORY_CARE_PROVIDER_SITE_OTHER): Admitting: Obstetrics

## 2023-11-03 VITALS — BP 135/71 | HR 98 | Wt 180.0 lb

## 2023-11-03 DIAGNOSIS — Z3480 Encounter for supervision of other normal pregnancy, unspecified trimester: Secondary | ICD-10-CM | POA: Diagnosis not present

## 2023-11-03 NOTE — Progress Notes (Signed)
    Return Prenatal Note   Subjective  33 y.o. G3P1011 at [redacted]w[redacted]d presents for this follow-up prenatal visit.   Patient interested in IOL, she has moved to [facility], has been unable to establish with new OB provider due to her late EGA so she is still hoping to deliver at [facility], but is worried about the timing and being able to make it in time from [place]. Also requesting refill on PNV.   Patient reports: Movement: Present Contractions: Irritability Denies vaginal bleeding or leaking fluid. Objective  Flow sheet Vitals: Pulse Rate: 98 BP: 135/71 Fundal Height: 39 cm Fetal Heart Rate (bpm): 145 Dilation: Closed Effacement (%): 40 Station: -3 Total weight gain: 45 lb (20.4 kg)  General Appearance  No acute distress, well appearing, and well nourished Pulmonary   Normal work of breathing Neurologic   Alert and oriented to person, place, and time Psychiatric   Mood and affect within normal limits   Assessment/Plan   Plan  33 y.o. G3P1011 at [redacted]w[redacted]d presents for follow-up OB visit. Reviewed prenatal record including previous visit note. 1. Supervision of other normal pregnancy, antepartum (Primary) - Prenatal Vit-Fe Fumarate-FA (MULTIVITAMIN-PRENATAL) 27-0.8 MG TABS tablet; Take 1 tablet by mouth daily.  Dispense: 90 tablet; Refill: 3 -Discussed elective IOL at 39+ wks to accommodate her living situation in [place] and gave her available dates/times for L&D. She will discuss with partner and message us  when she decides.  -Labor precautions  Return in about 1 week (around 11/10/2023).   Future Appointments  Date Time Provider Department Center  11/08/2023 10:15 AM Leigh Sober, MD [facility] None    For next visit:  continue with routine prenatal care    Sober Leigh, DO [facility]

## 2023-11-04 MED ORDER — PRENATAL 27-0.8 MG PO TABS
1.0000 | ORAL_TABLET | Freq: Every day | ORAL | 3 refills | Status: AC
Start: 2023-11-04 — End: ?

## 2023-11-07 ENCOUNTER — Encounter: Payer: Self-pay | Admitting: Obstetrics

## 2023-11-08 ENCOUNTER — Ambulatory Visit (INDEPENDENT_AMBULATORY_CARE_PROVIDER_SITE_OTHER): Admitting: Obstetrics

## 2023-11-08 VITALS — BP 129/89 | HR 81 | Wt 182.5 lb

## 2023-11-08 DIAGNOSIS — Z3A39 39 weeks gestation of pregnancy: Secondary | ICD-10-CM | POA: Diagnosis not present

## 2023-11-08 DIAGNOSIS — Z3480 Encounter for supervision of other normal pregnancy, unspecified trimester: Secondary | ICD-10-CM

## 2023-11-08 NOTE — Progress Notes (Signed)
    Return Prenatal Note   Subjective  33 y.o. G3P1011 at [redacted]w[redacted]d presents for this follow-up prenatal visit.   Patient feeling more Darol Irving and feels like baby dropped further. Requesting cervical exam today with membrane sweep if able.   Patient reports: Movement: Present Contractions: Irritability Denies vaginal bleeding or leaking fluid. Objective  Flow sheet Vitals: Pulse Rate: 81 BP: 129/89 Fundal Height: 39 cm Fetal Heart Rate (bpm): 138 Dilation: 1 Effacement (%): 30 Station: -3 Total weight gain: 47 lb 8 oz (21.5 kg)  General Appearance  No acute distress, well appearing, and well nourished Pulmonary   Normal work of breathing Neurologic   Alert and oriented to person, place, and time Psychiatric   Mood and affect within normal limits   Assessment/Plan   Plan  33 y.o. G3P1011 at [redacted]w[redacted]d by 6wk US  presents for follow-up OB visit. Reviewed prenatal record including previous visit note.  1. Supervision of other normal pregnancy, antepartum (Primary) 2. [redacted] weeks gestation of pregnancy -Cervix 1cm today, membrane sweep performed -Labor precautions reviewed, especially since pt lives in [place] -Answered questions re: IOL, would recommend at 41wks if not delivered  Return in about 1 week (around 11/15/2023) for ROB.   Future Appointments  Date Time Provider Department Center  11/15/2023  3:35 PM Justino Eleanor HERO, CNM [facility] None    For next visit:  continue with routine prenatal care    Estil Mangle, DO [facility]

## 2023-11-15 ENCOUNTER — Ambulatory Visit: Admitting: Obstetrics

## 2023-11-15 ENCOUNTER — Encounter: Payer: Self-pay | Admitting: Obstetrics

## 2023-11-15 VITALS — BP 136/88 | HR 87 | Wt 183.0 lb

## 2023-11-15 DIAGNOSIS — O480 Post-term pregnancy: Secondary | ICD-10-CM | POA: Diagnosis not present

## 2023-11-15 DIAGNOSIS — Z3A40 40 weeks gestation of pregnancy: Secondary | ICD-10-CM | POA: Diagnosis not present

## 2023-11-15 DIAGNOSIS — Z3480 Encounter for supervision of other normal pregnancy, unspecified trimester: Secondary | ICD-10-CM

## 2023-11-15 NOTE — Progress Notes (Signed)
    Return Prenatal Note   Assessment/Plan   Plan  33 y.o. G3P1011 at [redacted]w[redacted]d presents for follow-up OB visit. Reviewed prenatal record including previous visit note.  Supervision of other normal pregnancy, antepartum -Cephalic by BSUS -Prefers to await spontaneous labor. Counseled on risks of postdates pregnancy and recommended antenatal surveillance. Agreeable to IOL at 42 weeks if still pregnant. Will likely birth in [place] if she labors spontaneously. -RNST today.  -BPP & NST next week. -Membranes swept: 1/40/-3 -Reviewed labor warning signs. Instructed to call office or come to hospital with persistent headache, vision changes, regular contractions, leaking of fluid, decreased fetal movement or vaginal bleeding.      Orders Placed This Encounter  Procedures   US  Fetal BPP W/O Non Stress    Standing Status:   Future    Expected Date:   11/22/2023    Expiration Date:   11/14/2024    Reason for Exam (SYMPTOM  OR DIAGNOSIS REQUIRED):   Postdates    Preferred Imaging Location?:   [facility] @ [facility]   Fetal nonstress test   Return in about 1 week (around 11/22/2023).   Future Appointments  Date Time Provider Department Center  11/22/2023  9:55 AM Stephanie Reeves, CNM [facility] None  11/25/2023 10:15 AM AOB-NST ROOM [facility] None    For next visit:  ROB with BPP on Tuesday, NST on Friday    Subjective   Stephanie Reeves is feeling well. Baby is moving. No s/s of labor. She strongly prefers to await spontaneous labor.  Movement: Present Contractions: Irritability  Objective   Flow sheet Vitals: Pulse Rate: 87 BP: 136/88 Fundal Height: 38 cm Fetal Heart Rate (bpm): see NST Presentation: Vertex Dilation: 1 Effacement (%): 40 Station: -3 Total weight gain: 48 lb (21.8 kg)  General Appearance  No acute distress, well appearing, and well nourished Pulmonary   Normal work of breathing Neurologic   Alert and oriented to person, place, and time Psychiatric   Mood and affect  within normal limits    Non-Stress Test Interpretation Indication: Postdates Baseline: 125 Variability: moderate Accels: present Decels: None Toco: no ctx      Stephanie Reeves, CNM 11/15/23 5:26 PM

## 2023-11-15 NOTE — Assessment & Plan Note (Addendum)
-  Cephalic by BSUS -Prefers to await spontaneous labor. Counseled on risks of postdates pregnancy and recommended antenatal surveillance. Agreeable to IOL at 42 weeks if still pregnant. Will likely birth in [place] if she labors spontaneously. -RNST today.  -BPP & NST next week. -Membranes swept: 1/40/-3 -Reviewed labor warning signs. Instructed to call office or come to hospital with persistent headache, vision changes, regular contractions, leaking of fluid, decreased fetal movement or vaginal bleeding.

## 2023-11-22 ENCOUNTER — Ambulatory Visit (INDEPENDENT_AMBULATORY_CARE_PROVIDER_SITE_OTHER): Admitting: Licensed Practical Nurse

## 2023-11-22 VITALS — BP 163/95 | HR 101 | Wt 189.0 lb

## 2023-11-22 DIAGNOSIS — O163 Unspecified maternal hypertension, third trimester: Secondary | ICD-10-CM | POA: Diagnosis not present

## 2023-11-22 DIAGNOSIS — O480 Post-term pregnancy: Secondary | ICD-10-CM | POA: Diagnosis not present

## 2023-11-22 DIAGNOSIS — Z3A41 41 weeks gestation of pregnancy: Secondary | ICD-10-CM | POA: Diagnosis not present

## 2023-11-22 DIAGNOSIS — Z3480 Encounter for supervision of other normal pregnancy, unspecified trimester: Secondary | ICD-10-CM

## 2023-11-22 NOTE — Progress Notes (Unsigned)
    Return Prenatal Note   Subjective   33 y.o. G3P1011 at [redacted]w[redacted]d presents for this follow-up prenatal visit.  Patient Here with her partner.  Patient reports: initial BP 147/98. Stephanie Reeves has had a HA for the last 3 or 4 days, it is on her temples and in the back of her head, it feels like pressure the pain comes and goes, her right eye twitches otherwise denies visual disturbances. She has not tried anything for the HA. Eating seems to help the HA.  -Has had some cramping, feels ready to meet the baby, desires a sweep today.   Movement: Present Contractions: Not present  Objective   Flow sheet Vitals: Pulse Rate: (!) 101 BP: (!) 163/95 Fundal Height: 39 cm Fetal Heart Rate (bpm): 135 Presentation: Vertex Dilation: 2.5 Effacement (%): 60 Station: -3 Total weight gain: 54 lb (24.5 kg)  General Appearance  No acute distress, well appearing, and well nourished Pulmonary   Normal work of breathing Neurologic   Alert and oriented to person, place, and time Psychiatric   Mood and affect within normal limits   Assessment/Plan   Plan  33 y.o. G3P1011 at [redacted]w[redacted]d presents for follow-up OB visit. Reviewed prenatal record including previous visit note.  Supervision of other normal pregnancy, antepartum -VE 1.5/60/-3, membranes swept  -has NST and US  scheduled for later this week. Pt most likely will not need these as she is headed to [facility] now.   Elevated blood pressure affecting pregnancy in third trimester, antepartum -Instructed pt to go directly to a hospital for evaluation, discussed concerns for HA x3 or 4 days, elevated BP and now 41 weeks. Induction recommenced at this time. She may go to [facility]. Pt and her partner prefer to go to [facility] as this is closer to their home.  -Pt already enroute to [place] when the CNM made aware of repeat BP reading.      No orders of the defined types were placed in this encounter.  No follow-ups on file.   Future Appointments  Date Time  Provider Department Center  11/23/2023  1:00 PM [facility]  3 [facility]  [facility]  11/25/2023 10:15 AM A[facility]-NST ROOM [facility] None    For next visit:  pt headed to hospital      JINNIE HERO [facility], CNM  11/22/2510:45 PM

## 2023-11-22 NOTE — Assessment & Plan Note (Addendum)
-  Instructed pt to go directly to a hospital for evaluation, discussed concerns for HA x3 or 4 days, elevated BP and now 41 weeks. Discussed concerns for preeclampsia.  Induction recommenced at this time. She may go to [facility]. Pt and her partner prefer to go to [facility] as this is closer to their home.  -Pt already enroute to [place] when the CNM made aware of repeat BP reading.

## 2023-11-22 NOTE — Assessment & Plan Note (Signed)
-  VE 1.5/60/-3, membranes swept  -has NST and US  scheduled for later this week. Pt most likely will not need these as she is headed to [facility] now.

## 2023-11-23 ENCOUNTER — Ambulatory Visit: Admission: RE | Admit: 2023-11-23 | Source: Ambulatory Visit

## 2023-11-25 ENCOUNTER — Other Ambulatory Visit

## 2023-12-02 ENCOUNTER — Other Ambulatory Visit: Payer: Self-pay | Admitting: Certified Nurse Midwife
